# Patient Record
Sex: Male | Born: 1975
Health system: Southern US, Community
[De-identification: ages and names within clinical notes are randomized; demographics above are authoritative.]

## PROBLEM LIST (undated history)

## (undated) DIAGNOSIS — M545 Low back pain, unspecified: Secondary | ICD-10-CM

## (undated) DIAGNOSIS — I10 Essential (primary) hypertension: Secondary | ICD-10-CM

## (undated) DIAGNOSIS — K219 Gastro-esophageal reflux disease without esophagitis: Secondary | ICD-10-CM

## (undated) DIAGNOSIS — N200 Calculus of kidney: Secondary | ICD-10-CM

## (undated) DIAGNOSIS — G4733 Obstructive sleep apnea (adult) (pediatric): Secondary | ICD-10-CM

## (undated) HISTORY — DX: Gastro-esophageal reflux disease without esophagitis: K21.9

## (undated) HISTORY — PX: LITHOTRIPSY: SUR834

## (undated) HISTORY — DX: Essential (primary) hypertension: I10

## (undated) HISTORY — DX: Calculus of kidney: N20.0

## (undated) HISTORY — DX: Obstructive sleep apnea (adult) (pediatric): G47.33

## (undated) HISTORY — DX: Low back pain, unspecified: M54.50

## (undated) HISTORY — PX: WRIST SURGERY: SHX841

---
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
## 1998-06-05 ENCOUNTER — Encounter: Payer: Self-pay | Admitting: Emergency Medicine

## 1998-06-05 ENCOUNTER — Emergency Department (HOSPITAL_COMMUNITY): Admission: EM | Admit: 1998-06-05 | Discharge: 1998-06-05 | Payer: Self-pay | Admitting: Emergency Medicine

## 1999-07-19 HISTORY — PX: VASECTOMY: SHX75

## 2001-04-06 ENCOUNTER — Other Ambulatory Visit: Admission: RE | Admit: 2001-04-06 | Discharge: 2001-04-06 | Payer: Self-pay | Admitting: Urology

## 2002-02-11 ENCOUNTER — Emergency Department (HOSPITAL_COMMUNITY): Admission: EM | Admit: 2002-02-11 | Discharge: 2002-02-11 | Payer: Self-pay | Admitting: Emergency Medicine

## 2004-07-16 ENCOUNTER — Ambulatory Visit (HOSPITAL_COMMUNITY): Admission: RE | Admit: 2004-07-16 | Discharge: 2004-07-16 | Payer: Self-pay | Admitting: Pediatrics

## 2007-02-20 ENCOUNTER — Emergency Department (HOSPITAL_COMMUNITY): Admission: EM | Admit: 2007-02-20 | Discharge: 2007-02-20 | Payer: Self-pay | Admitting: *Deleted

## 2012-02-03 ENCOUNTER — Other Ambulatory Visit: Payer: Self-pay | Admitting: Urology

## 2012-04-17 DIAGNOSIS — K2100 Gastro-esophageal reflux disease with esophagitis, without bleeding: Secondary | ICD-10-CM | POA: Insufficient documentation

## 2012-11-01 DIAGNOSIS — M542 Cervicalgia: Secondary | ICD-10-CM | POA: Insufficient documentation

## 2014-09-12 DIAGNOSIS — J111 Influenza due to unidentified influenza virus with other respiratory manifestations: Secondary | ICD-10-CM | POA: Insufficient documentation

## 2014-12-25 DIAGNOSIS — J039 Acute tonsillitis, unspecified: Secondary | ICD-10-CM | POA: Insufficient documentation

## 2015-07-05 DIAGNOSIS — J0121 Acute recurrent ethmoidal sinusitis: Secondary | ICD-10-CM | POA: Insufficient documentation

## 2015-09-20 DIAGNOSIS — J069 Acute upper respiratory infection, unspecified: Secondary | ICD-10-CM | POA: Insufficient documentation

## 2015-09-20 DIAGNOSIS — J029 Acute pharyngitis, unspecified: Secondary | ICD-10-CM | POA: Insufficient documentation

## 2015-10-01 DIAGNOSIS — J040 Acute laryngitis: Secondary | ICD-10-CM | POA: Insufficient documentation

## 2015-10-01 DIAGNOSIS — H66002 Acute suppurative otitis media without spontaneous rupture of ear drum, left ear: Secondary | ICD-10-CM | POA: Insufficient documentation

## 2016-09-14 DIAGNOSIS — R030 Elevated blood-pressure reading, without diagnosis of hypertension: Secondary | ICD-10-CM | POA: Insufficient documentation

## 2016-10-19 DIAGNOSIS — R490 Dysphonia: Secondary | ICD-10-CM | POA: Insufficient documentation

## 2017-03-24 DIAGNOSIS — G4700 Insomnia, unspecified: Secondary | ICD-10-CM | POA: Insufficient documentation

## 2017-04-12 DIAGNOSIS — Z87442 Personal history of urinary calculi: Secondary | ICD-10-CM | POA: Insufficient documentation

## 2017-05-17 DIAGNOSIS — J0101 Acute recurrent maxillary sinusitis: Secondary | ICD-10-CM | POA: Insufficient documentation

## 2017-06-14 DIAGNOSIS — M5431 Sciatica, right side: Secondary | ICD-10-CM | POA: Insufficient documentation

## 2017-12-25 DIAGNOSIS — M25521 Pain in right elbow: Secondary | ICD-10-CM | POA: Diagnosis not present

## 2017-12-25 DIAGNOSIS — M542 Cervicalgia: Secondary | ICD-10-CM | POA: Diagnosis not present

## 2017-12-25 DIAGNOSIS — M5136 Other intervertebral disc degeneration, lumbar region: Secondary | ICD-10-CM | POA: Diagnosis not present

## 2017-12-29 DIAGNOSIS — N200 Calculus of kidney: Secondary | ICD-10-CM | POA: Diagnosis not present

## 2017-12-29 DIAGNOSIS — E291 Testicular hypofunction: Secondary | ICD-10-CM | POA: Diagnosis not present

## 2018-01-02 DIAGNOSIS — E291 Testicular hypofunction: Secondary | ICD-10-CM | POA: Diagnosis not present

## 2018-01-22 DIAGNOSIS — M5136 Other intervertebral disc degeneration, lumbar region: Secondary | ICD-10-CM | POA: Diagnosis not present

## 2018-01-22 DIAGNOSIS — M5416 Radiculopathy, lumbar region: Secondary | ICD-10-CM | POA: Diagnosis not present

## 2018-02-19 DIAGNOSIS — T63451A Toxic effect of venom of hornets, accidental (unintentional), initial encounter: Secondary | ICD-10-CM | POA: Diagnosis not present

## 2018-03-05 DIAGNOSIS — M5416 Radiculopathy, lumbar region: Secondary | ICD-10-CM | POA: Diagnosis not present

## 2018-03-05 DIAGNOSIS — M5136 Other intervertebral disc degeneration, lumbar region: Secondary | ICD-10-CM | POA: Diagnosis not present

## 2018-03-05 DIAGNOSIS — M542 Cervicalgia: Secondary | ICD-10-CM | POA: Diagnosis not present

## 2018-03-22 DIAGNOSIS — E291 Testicular hypofunction: Secondary | ICD-10-CM | POA: Diagnosis not present

## 2018-03-30 DIAGNOSIS — E291 Testicular hypofunction: Secondary | ICD-10-CM | POA: Diagnosis not present

## 2018-03-30 DIAGNOSIS — N529 Male erectile dysfunction, unspecified: Secondary | ICD-10-CM | POA: Diagnosis not present

## 2018-04-30 DIAGNOSIS — J029 Acute pharyngitis, unspecified: Secondary | ICD-10-CM | POA: Diagnosis not present

## 2018-04-30 DIAGNOSIS — Z6829 Body mass index (BMI) 29.0-29.9, adult: Secondary | ICD-10-CM | POA: Diagnosis not present

## 2018-06-20 DIAGNOSIS — E291 Testicular hypofunction: Secondary | ICD-10-CM | POA: Diagnosis not present

## 2018-06-26 DIAGNOSIS — N529 Male erectile dysfunction, unspecified: Secondary | ICD-10-CM | POA: Diagnosis not present

## 2018-06-26 DIAGNOSIS — E291 Testicular hypofunction: Secondary | ICD-10-CM | POA: Diagnosis not present

## 2019-07-25 ENCOUNTER — Other Ambulatory Visit: Payer: Self-pay

## 2019-07-25 ENCOUNTER — Ambulatory Visit: Payer: 59 | Attending: Internal Medicine

## 2019-07-25 DIAGNOSIS — Z20822 Contact with and (suspected) exposure to covid-19: Secondary | ICD-10-CM

## 2019-07-27 LAB — NOVEL CORONAVIRUS, NAA: SARS-CoV-2, NAA: NOT DETECTED

## 2019-07-29 ENCOUNTER — Ambulatory Visit: Payer: 59 | Attending: Internal Medicine

## 2019-07-29 ENCOUNTER — Other Ambulatory Visit: Payer: Self-pay

## 2019-07-29 DIAGNOSIS — Z20822 Contact with and (suspected) exposure to covid-19: Secondary | ICD-10-CM

## 2019-07-30 ENCOUNTER — Other Ambulatory Visit: Payer: Self-pay | Admitting: Urology

## 2019-07-30 ENCOUNTER — Other Ambulatory Visit: Payer: 59

## 2019-07-30 LAB — NOVEL CORONAVIRUS, NAA: SARS-CoV-2, NAA: DETECTED — AB

## 2019-08-14 ENCOUNTER — Other Ambulatory Visit: Payer: Self-pay

## 2019-08-14 DIAGNOSIS — E291 Testicular hypofunction: Secondary | ICD-10-CM

## 2019-08-15 ENCOUNTER — Other Ambulatory Visit: Payer: Self-pay | Admitting: Urology

## 2019-08-15 DIAGNOSIS — R079 Chest pain, unspecified: Secondary | ICD-10-CM | POA: Insufficient documentation

## 2019-08-15 LAB — CBC WITH DIFFERENTIAL/PLATELET
Absolute Monocytes: 536 cells/uL (ref 200–950)
Basophils Absolute: 57 cells/uL (ref 0–200)
Basophils Relative: 1.1 %
Eosinophils Absolute: 161 cells/uL (ref 15–500)
Eosinophils Relative: 3.1 %
HCT: 45.7 % (ref 38.5–50.0)
Hemoglobin: 15.5 g/dL (ref 13.2–17.1)
Lymphs Abs: 1872 cells/uL (ref 850–3900)
MCH: 30.3 pg (ref 27.0–33.0)
MCHC: 33.9 g/dL (ref 32.0–36.0)
MCV: 89.3 fL (ref 80.0–100.0)
MPV: 11.2 fL (ref 7.5–12.5)
Monocytes Relative: 10.3 %
Neutro Abs: 2574 cells/uL (ref 1500–7800)
Neutrophils Relative %: 49.5 %
Platelets: 67 10*3/uL — ABNORMAL LOW (ref 140–400)
RBC: 5.12 10*6/uL (ref 4.20–5.80)
RDW: 12.6 % (ref 11.0–15.0)
Total Lymphocyte: 36 %
WBC: 5.2 10*3/uL (ref 3.8–10.8)

## 2019-08-15 LAB — COMPREHENSIVE METABOLIC PANEL
AG Ratio: 2 (calc) (ref 1.0–2.5)
ALT: 16 U/L (ref 9–46)
AST: 15 U/L (ref 10–40)
Albumin: 4.3 g/dL (ref 3.6–5.1)
Alkaline phosphatase (APISO): 50 U/L (ref 36–130)
BUN: 13 mg/dL (ref 7–25)
CO2: 28 mmol/L (ref 20–32)
Calcium: 9.2 mg/dL (ref 8.6–10.3)
Chloride: 107 mmol/L (ref 98–110)
Creat: 1.35 mg/dL (ref 0.60–1.35)
Globulin: 2.1 g/dL (calc) (ref 1.9–3.7)
Glucose, Bld: 87 mg/dL (ref 65–99)
Potassium: 4 mmol/L (ref 3.5–5.3)
Sodium: 143 mmol/L (ref 135–146)
Total Bilirubin: 0.8 mg/dL (ref 0.2–1.2)
Total Protein: 6.4 g/dL (ref 6.1–8.1)

## 2019-08-15 LAB — PSA: PSA: 0.7 ng/mL (ref <=4.0)

## 2019-08-15 LAB — TESTOSTERONE TOTAL,FREE,BIO, MALES
Albumin: 4.3 g/dL (ref 3.6–5.1)
Sex Hormone Binding: 63 nmol/L — ABNORMAL HIGH (ref 10–50)
Testosterone, Bioavailable: 160.9 ng/dL (ref >=110.0)
Testosterone, Free: 81.7 pg/mL (ref 46.0–224.0)
Testosterone: 963 ng/dL — ABNORMAL HIGH (ref 250–827)

## 2019-08-15 LAB — ESTRADIOL: Estradiol: 31 pg/mL (ref <=39)

## 2019-08-27 ENCOUNTER — Encounter: Payer: Self-pay | Admitting: *Deleted

## 2019-08-28 ENCOUNTER — Encounter: Payer: Self-pay | Admitting: *Deleted

## 2019-08-28 ENCOUNTER — Encounter: Payer: Self-pay | Admitting: Cardiology

## 2019-08-28 ENCOUNTER — Other Ambulatory Visit: Payer: Self-pay

## 2019-08-28 ENCOUNTER — Telehealth: Payer: Self-pay | Admitting: Cardiology

## 2019-08-28 ENCOUNTER — Ambulatory Visit: Payer: 59 | Admitting: Cardiology

## 2019-08-28 VITALS — BP 140/100 | HR 73 | Ht 70.0 in | Wt 224.0 lb

## 2019-08-28 DIAGNOSIS — Z8249 Family history of ischemic heart disease and other diseases of the circulatory system: Secondary | ICD-10-CM | POA: Diagnosis not present

## 2019-08-28 DIAGNOSIS — I10 Essential (primary) hypertension: Secondary | ICD-10-CM | POA: Diagnosis not present

## 2019-08-28 DIAGNOSIS — K219 Gastro-esophageal reflux disease without esophagitis: Secondary | ICD-10-CM

## 2019-08-28 DIAGNOSIS — R0789 Other chest pain: Secondary | ICD-10-CM | POA: Diagnosis not present

## 2019-08-28 NOTE — Telephone Encounter (Signed)
Pre-cert Verification for the following procedure    Stress echo scheduled for 2021 at [facility].

## 2019-08-28 NOTE — Patient Instructions (Addendum)
Medication Instructions:   Your physician recommends that you continue on your current medications as directed. Please refer to the Current Medication list given to you today.  Labwork:  NONE  Testing/Procedures: Your physician has requested that you have a stress echocardiogram. For further information please visit [email or web address]. Please follow instruction sheet as given.  Follow-Up:  Your physician recommends that you schedule a follow-up appointment in: pending.  Any Other Special Instructions Will Be Listed Below (If Applicable).  If you need a refill on your cardiac medications before your next appointment, please call your pharmacy. 

## 2019-08-28 NOTE — Progress Notes (Signed)
Cardiology Office Note  Date: 08/28/2019   ID: Jamie Richmond, DOB [birth year], MRN [id number]  PCP:  Manon Hilding, MD  Consulting Cardiologist: Satira Sark, MD Electrophysiologist:  None   Chief Complaint  Patient presents with  . Chest Pain    History of Present Illness: Jamie Richmond is a 44 y.o. male referred for cardiology consultation by Dr. Quintin Alto for the evaluation of chest pain.  He states that over the last few months he has been experiencing episodes of a sharp discomfort in his chest followed by tightness, sometimes nausea and uneasiness when this happens.  He cannot identify any specific trigger however.  This is not related to foods.  He does have reflux and takes Protonix, denies any dysphagia however.  Symptoms are nonexertional.  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  He has not had any palpitations or syncope.  States that his blood pressure has been up over the last 6 months or so, now on Cozaar.  Lipid status is uncertain, but he is not aware of any history of hyperlipidemia.  I did review his recent lab work from Dr. Alyson Ingles as outlined below.  He is noted to be thrombocytopenic, new finding compared to previous lab work.  No spontaneous bleeding problems.  No clumping described on the sample.  He is following up with Dr. Noland Fordyce office regarding next step in work-up.   He does have a family history of CAD.  Also reports prior sleep testing and diagnosis of OSA, however not able to tolerate CPAP.  He quit smoking back in 2014.  Past Medical History:  Diagnosis Date  . Essential hypertension   . GERD (gastroesophageal reflux disease)   . Low back pain   . Nephrolithiasis   . OSA (obstructive sleep apnea)    Not on CPAP    Past Surgical History:  Procedure Laterality Date  . LITHOTRIPSY    . VASECTOMY  2001  . WRIST SURGERY Right    age 78    Current Outpatient Medications  Medication Sig Dispense  Refill  . anastrozole (ARIMIDEX) 1 MG tablet Take 0.5 mg by mouth daily.    . clomiPHENE (CLOMID) 50 MG tablet Take 1/2 (one-half) tablet by mouth once daily 15 tablet 0  . gabapentin (NEURONTIN) 100 MG capsule Take 100 mg by mouth at bedtime.    Marland Kitchen losartan (COZAAR) 25 MG tablet Take 25 mg by mouth daily.    . meloxicam (MOBIC) 15 MG tablet Take 15 mg by mouth daily.    . pantoprazole (PROTONIX) 40 MG tablet Take 40 mg by mouth 2 (two) times daily.    Marland Kitchen SILDENAFIL CITRATE PO Take 20 mg by mouth as needed.    Marland Kitchen tiZANidine (ZANAFLEX) 4 MG tablet Take 4 mg by mouth every 8 (eight) hours as needed for muscle spasms.    Marland Kitchen zolpidem (AMBIEN) 10 MG tablet Take 10 mg by mouth at bedtime as needed for sleep.     No current facility-administered medications for this visit.   Allergies:  Patient has no known allergies.   Social History: The patient  reports that he has quit smoking. His smoking use included cigarettes. He started smoking about 6 years ago. He smoked 0.50 packs per day. He has never used smokeless tobacco. He reports current alcohol use. He reports that he does not use drugs.   Family History: The patient's family history includes Bipolar disorder in his  sister and sister; Crohn's disease in his sister; Heart attack in his father and mother; Hypertension in his father, mother, sister, and sister.   ROS:  Please see the history of present illness. Otherwise, complete review of systems is positive for none.  All other systems are reviewed and negative.   Physical Exam: VS:  BP (!) 140/100   Pulse 73   Ht 5\' 10"  (1.778 m)   Wt 224 lb (101.6 kg)   SpO2 96%   BMI 32.14 kg/m , BMI Body mass index is 32.14 kg/m.  Wt Readings from Last 3 Encounters:  08/28/19 224 lb (101.6 kg)    General: Patient appears comfortable at rest. HEENT: Conjunctiva and lids normal, wearing a mask. Neck: Supple, no elevated JVP or carotid bruits, no thyromegaly. Lungs: Clear to auscultation, nonlabored  breathing at rest. Cardiac: Regular rate and rhythm, no S3 or significant systolic murmur, no pericardial rub. Abdomen: Soft, bowel sounds present. Extremities: No pitting edema, distal pulses 2+. Skin: Warm and dry. Musculoskeletal: No kyphosis. Neuropsychiatric: Alert and oriented x3, affect grossly appropriate.  ECG:  An ECG dated August 16, 2019 from [facility] was personally reviewed today and demonstrated:  Sinus bradycardia.  Recent Labwork: 08/14/2019: ALT 16; AST 15; BUN 13; Creat 1.35; Hemoglobin 15.5; Platelets 67; Potassium 4.0; Sodium 143  No lipid profile available for review.  Other Studies Reviewed Today:  No prior cardiac testing for review.  Assessment and Plan:  1.  Atypical chest pain as described above in a 44 year old male with hypertension, family history of CAD, previous tobacco use but quit in 2014.  Lipid status is uncertain.  I reviewed his recent ECG.  Plan is to proceed with an exercise echocardiogram for objective ischemic work-up.  2.  Essential hypertension, currently on Cozaar.  Blood pressure is elevated today.  Would continue to follow with Dr. 2015 in case additional adjustments are necessary.  Untreated OSA may also be contributing.  3.  Incidentally noted thrombocytopenia with platelet count 67 by recent lab work by Dr. Neita Carp.  No clumping reported on sample and differential otherwise looked okay.  He plans to follow-up with Dr. Ronne Binning office.  Would anticipate a repeat blood draw and if platelets remain low consider hematology consultation.  4.  Uncertain lipid status.  Would follow with Dr. Dimas Millin for surveillance.  5.  GERD, on Protonix.  Medication Adjustments/Labs and Tests Ordered: Current medicines are reviewed at length with the patient today.  Concerns regarding medicines are outlined above.   Tests Ordered: Orders Placed This Encounter  Procedures  . ECHOCARDIOGRAM STRESS TEST    Medication Changes: No orders of the  defined types were placed in this encounter.   Disposition:  Follow up test results.  Signed, Neita Carp, MD, [facility] 08/28/2019 9:37 AM    [facility] at [facility] [address] [place], [place], [place] Phone: [phone]; Fax: [phone]

## 2019-09-02 ENCOUNTER — Ambulatory Visit (HOSPITAL_COMMUNITY)
Admission: RE | Admit: 2019-09-02 | Discharge: 2019-09-02 | Disposition: A | Discharge status: Home / Self Care | Payer: 59 | Source: Ambulatory Visit | Attending: Cardiology | Admitting: Cardiology

## 2019-09-02 ENCOUNTER — Other Ambulatory Visit: Payer: Self-pay

## 2019-09-02 DIAGNOSIS — Z8249 Family history of ischemic heart disease and other diseases of the circulatory system: Secondary | ICD-10-CM | POA: Insufficient documentation

## 2019-09-02 DIAGNOSIS — R0789 Other chest pain: Secondary | ICD-10-CM

## 2019-09-02 NOTE — Progress Notes (Signed)
*  PRELIMINARY RESULTS* Echocardiogram Echocardiogram Stress Test has been performed.  Jamie Richmond 09/02/2019, 10:27 AM

## 2019-09-04 ENCOUNTER — Telehealth: Payer: Self-pay | Admitting: *Deleted

## 2019-09-04 ENCOUNTER — Other Ambulatory Visit: Payer: Self-pay | Admitting: Urology

## 2019-09-04 NOTE — Telephone Encounter (Signed)
Patient informed. Copy sent to PCP °

## 2019-09-04 NOTE — Telephone Encounter (Signed)
-----   Message from Jonelle Sidle, MD sent at 09/02/2019  6:03 PM EST ----- Results reviewed.  Please let him know that the stress test looked good, no evidence of ischemia to suggest presence of obstructive CAD.  Would encourage him to keep follow-up with PCP at this point.

## 2019-09-29 ENCOUNTER — Other Ambulatory Visit: Payer: Self-pay | Admitting: Urology

## 2020-01-14 ENCOUNTER — Other Ambulatory Visit: Payer: Self-pay

## 2020-01-14 DIAGNOSIS — E291 Testicular hypofunction: Secondary | ICD-10-CM

## 2020-03-05 NOTE — Addendum Note (Signed)
Addended by: Ferdinand Lango on: 03/05/2020 01:19 PM   Modules accepted: Orders

## 2020-04-06 ENCOUNTER — Other Ambulatory Visit: Payer: 59

## 2020-04-09 ENCOUNTER — Ambulatory Visit: Payer: 59 | Admitting: Urology

## 2020-09-01 ENCOUNTER — Other Ambulatory Visit: Payer: Self-pay | Admitting: Urology

## 2020-12-17 ENCOUNTER — Other Ambulatory Visit: Payer: Self-pay

## 2020-12-17 DIAGNOSIS — E291 Testicular hypofunction: Secondary | ICD-10-CM

## 2020-12-17 NOTE — Progress Notes (Signed)
Patient called for lab orders prior to his July ov. Perfers [facility]. Labs ordered printed and left at desk for pt to pick up.

## 2021-01-15 ENCOUNTER — Other Ambulatory Visit: Payer: Self-pay

## 2021-01-15 DIAGNOSIS — E291 Testicular hypofunction: Secondary | ICD-10-CM

## 2021-01-16 LAB — COMPREHENSIVE METABOLIC PANEL
ALT: 31 IU/L (ref 0–44)
AST: 25 IU/L (ref 0–40)
Albumin/Globulin Ratio: 2 (ref 1.2–2.2)
Albumin: 4.5 g/dL (ref 4.0–5.0)
Alkaline Phosphatase: 56 IU/L (ref 44–121)
BUN/Creatinine Ratio: 12 (ref 9–20)
BUN: 15 mg/dL (ref 6–24)
Bilirubin Total: 0.6 mg/dL (ref 0.0–1.2)
CO2: 23 mmol/L (ref 20–29)
Calcium: 9.4 mg/dL (ref 8.7–10.2)
Chloride: 105 mmol/L (ref 96–106)
Creatinine, Ser: 1.23 mg/dL (ref 0.76–1.27)
Globulin, Total: 2.2 g/dL (ref 1.5–4.5)
Glucose: 78 mg/dL (ref 65–99)
Potassium: 4.6 mmol/L (ref 3.5–5.2)
Sodium: 141 mmol/L (ref 134–144)
Total Protein: 6.7 g/dL (ref 6.0–8.5)
eGFR: 74 mL/min/{1.73_m2} (ref >59)

## 2021-01-16 LAB — CBC
Hematocrit: 50 % (ref 37.5–51.0)
Hemoglobin: 16.5 g/dL (ref 13.0–17.7)
MCH: 31.2 pg (ref 26.6–33.0)
MCHC: 33 g/dL (ref 31.5–35.7)
MCV: 95 fL (ref 79–97)
RBC: 5.29 x10E6/uL (ref 4.14–5.80)
RDW: 12.6 % (ref 11.6–15.4)
WBC: 6.2 10*3/uL (ref 3.4–10.8)

## 2021-01-16 LAB — TESTOSTERONE: Testosterone: 740 ng/dL (ref 264–916)

## 2021-01-16 LAB — ESTRADIOL: Estradiol: 28.2 pg/mL (ref 7.6–42.6)

## 2021-01-26 NOTE — Progress Notes (Signed)
Sent via mychart

## 2021-01-29 ENCOUNTER — Encounter: Payer: Self-pay | Admitting: Urology

## 2021-01-29 ENCOUNTER — Other Ambulatory Visit: Payer: Self-pay

## 2021-01-29 ENCOUNTER — Ambulatory Visit (INDEPENDENT_AMBULATORY_CARE_PROVIDER_SITE_OTHER): Payer: 59 | Admitting: Urology

## 2021-01-29 VITALS — BP 137/85 | HR 69

## 2021-01-29 DIAGNOSIS — N5201 Erectile dysfunction due to arterial insufficiency: Secondary | ICD-10-CM

## 2021-01-29 DIAGNOSIS — E291 Testicular hypofunction: Secondary | ICD-10-CM | POA: Insufficient documentation

## 2021-01-29 HISTORY — DX: Erectile dysfunction due to arterial insufficiency: N52.01

## 2021-01-29 HISTORY — DX: Testicular hypofunction: E29.1

## 2021-01-29 LAB — URINALYSIS, ROUTINE W REFLEX MICROSCOPIC
Bilirubin, UA: NEGATIVE
Glucose, UA: NEGATIVE
Ketones, UA: NEGATIVE
Leukocytes,UA: NEGATIVE
Nitrite, UA: NEGATIVE
Protein,UA: NEGATIVE
RBC, UA: NEGATIVE
Specific Gravity, UA: 1.02 (ref 1.005–1.030)
Urobilinogen, Ur: 0.2 mg/dL (ref 0.2–1.0)
pH, UA: 7 (ref 5.0–7.5)

## 2021-01-29 MED ORDER — TESTOSTERONE CYPIONATE 200 MG/ML IM SOLN: 200.0000 mg | INTRAMUSCULAR | 3 refills | Status: DC

## 2021-01-29 MED ORDER — SILDENAFIL CITRATE 20 MG PO TABS: 20.0000 mg | ORAL_TABLET | ORAL | 3 refills | Status: AC | PRN

## 2021-01-29 MED ORDER — TADALAFIL 20 MG PO TABS: 20.0000 mg | ORAL_TABLET | Freq: Every day | ORAL | 5 refills | Status: DC | PRN

## 2021-01-29 NOTE — Progress Notes (Signed)
Urological Symptom Review  Patient is experiencing the following symptoms: Kidney stones   Review of Systems  Gastrointestinal (upper)  : Negative for upper GI symptoms  Gastrointestinal (lower) : Negative for lower GI symptoms  Constitutional : Negative for symptoms  Skin: Negative for skin symptoms  Eyes: Negative for eye symptoms  Ear/Nose/Throat : Negative for Ear/Nose/Throat symptoms  Hematologic/Lymphatic: Negative for Hematologic/Lymphatic symptoms  Cardiovascular : Negative for cardiovascular symptoms  Respiratory : Negative for respiratory symptoms  Endocrine: Negative for endocrine symptoms  Musculoskeletal: Back pain  Neurological: Negative for neurological symptoms  Psychologic: Negative for psychiatric symptoms  

## 2021-01-29 NOTE — Progress Notes (Signed)
01/29/2021 2:06 PM   Jamie Richmond 05-03-1976 [id number]  Referring provider: Estanislado Pandy, MD [address] [place],  [place] [address]  Hypogonadism and ED   HPI: Jamie Richmond is a 45yo here for followup for ED and hypogonadism. He has tapered off anastrazole and clomid. Testosteron 740. CMP normal, Hgb 16.5 He has fair erections with tadalafil and sildenafil.   PMH: Past Medical History:  Diagnosis Date   Essential hypertension    GERD (gastroesophageal reflux disease)    Low back pain    Nephrolithiasis    OSA (obstructive sleep apnea)    Not on CPAP    Surgical History: Past Surgical History:  Procedure Laterality Date   LITHOTRIPSY     VASECTOMY  2001   WRIST SURGERY Right    age 17    Home Medications:  Allergies as of 01/29/2021   No Known Allergies      Medication List        Accurate as of January 29, 2021  2:06 PM. If you have any questions, ask your nurse or doctor.          anastrozole 1 MG tablet Commonly known as: ARIMIDEX TAKE 1/2 (ONE-HALF) TABLET BY MOUTH EVERY OTHER DAY   clomiPHENE 50 MG tablet Commonly known as: CLOMID Take 1/2 (one-half) tablet by mouth once daily   gabapentin 100 MG capsule Commonly known as: NEURONTIN Take 100 mg by mouth at bedtime.   losartan 25 MG tablet Commonly known as: COZAAR Take 25 mg by mouth daily.   losartan 50 MG tablet Commonly known as: COZAAR Take 50 mg by mouth daily.   meloxicam 15 MG tablet Commonly known as: MOBIC Take 15 mg by mouth daily.   pantoprazole 40 MG tablet Commonly known as: PROTONIX Take 40 mg by mouth 2 (two) times daily.   rosuvastatin 10 MG tablet Commonly known as: CRESTOR Take 10 mg by mouth at bedtime.   SILDENAFIL CITRATE PO Take 20 mg by mouth as needed.   tiZANidine 4 MG tablet Commonly known as: ZANAFLEX Take 4 mg by mouth every 8 (eight) hours as needed for muscle spasms.   zolpidem 10 MG tablet Commonly known as: AMBIEN Take 10 mg by mouth at bedtime  as needed for sleep.        Allergies: No Known Allergies  Family History: Family History  Problem Relation Age of Onset   Heart attack Mother    Hypertension Mother    Hypertension Father    Heart attack Father    Bipolar disorder Sister    Hypertension Sister    Crohn's disease Sister    Bipolar disorder Sister    Hypertension Sister     Social History:  reports that he has quit smoking. His smoking use included cigarettes. He started smoking about 8 years ago. He smoked an average of .5 packs per day. He has never used smokeless tobacco. He reports current alcohol use. He reports that he does not use drugs.  ROS: All other review of systems were reviewed and are negative except what is noted above in HPI  Physical Exam: BP 137/85   Pulse 69   Constitutional:  Alert and oriented, No acute distress. HEENT: [place] AT, moist mucus membranes.  Trachea midline, no masses. Cardiovascular: No clubbing, cyanosis, or edema. Respiratory: Normal respiratory effort, no increased work of breathing. GI: Abdomen is soft, nontender, nondistended, no abdominal masses GU: No CVA tenderness.  Lymph: No cervical or inguinal  lymphadenopathy. Skin: No rashes, bruises or suspicious lesions. Neurologic: Grossly intact, no focal deficits, moving all 4 extremities. Psychiatric: Normal mood and affect.  Laboratory Data: Lab Results  Component Value Date   WBC 6.2 01/15/2021   HGB 16.5 01/15/2021   HCT 50.0 01/15/2021   MCV 95 01/15/2021   PLT Comment 01/15/2021    Lab Results  Component Value Date   CREATININE 1.23 01/15/2021    Lab Results  Component Value Date   PSA 0.7 08/14/2019    Lab Results  Component Value Date   TESTOSTERONE 740 01/15/2021    No results found for: HGBA1C  Urinalysis No results found for: COLORURINE, APPEARANCEUR, LABSPEC, PHURINE, GLUCOSEU, HGBUR, BILIRUBINUR, KETONESUR, PROTEINUR, UROBILINOGEN, NITRITE, LEUKOCYTESUR  No results found for: LABMICR,  WBCUA, RBCUA, LABEPIT, MUCUS, BACTERIA  Pertinent Imaging:  No results found for this or any previous visit.  No results found for this or any previous visit.  No results found for this or any previous visit.  No results found for this or any previous visit.  No results found for this or any previous visit.  No results found for this or any previous visit.  No results found for this or any previous visit.  No results found for this or any previous visit.   Assessment & Plan:    1. Male hypogonadism -testosteron 200mg  q 2 weeks -RTC 3 months with labs - Urinalysis, Routine w reflex microscopic  2. Erectile dysfunction due to arterial insufficiency -continue sildenafil and tadalafil   No follow-ups on file.  , MD  [facility]

## 2021-01-29 NOTE — Patient Instructions (Signed)
Erectile Dysfunction Erectile dysfunction (ED) is the inability to get or keep an erection in order to have sexual intercourse. ED is considered a symptom of an underlying disorder and not considered a disease. Erectile dysfunction may include: Inability to get an erection. Lack of enough hardness of the erection to allow penetration. Loss of the erection before sex is finished. What are the causes? This condition may be caused by: Certain medicines, such as: Pain relievers. Antihistamines. Antidepressants. Blood pressure medicines. Water pills (diuretics). Ulcer medicines. Muscle relaxants. Drugs. Excessive drinking. Psychological causes, such as: Anxiety. Depression. Sadness. Exhaustion. Performance fear. Stress. Physical causes, such as: Artery problems. This may include diabetes, smoking, liver disease, or atherosclerosis. High blood pressure. Hormonal problems, such as low testosterone. Obesity. Nerve problems. This may include back or pelvic injuries, diabetes mellitus, multiple sclerosis, or Parkinson's disease. What are the signs or symptoms? Symptoms of this condition include: Inability to get an erection. Lack of enough hardness of the erection to allow penetration. Loss of the erection before sex is finished. Normal erections at some times, but with frequent unsatisfactory episodes. Low sexual satisfaction in either partner due to erection problems. A curved penis occurring with erection. The curve may cause pain or the penis may be too curved to allow for intercourse. Never having nighttime erections. How is this diagnosed? This condition is often diagnosed by: Performing a physical exam to find other diseases or specific problems with the penis. Asking you detailed questions about the problem. Performing blood tests to check for diabetes mellitus or to measure hormone levels. Performing other tests to check for underlying health conditions. Performing an  ultrasound exam to check for scarring. Performing a test to check blood flow to the penis. Doing a sleep study at home to measure nighttime erections. How is this treated? This condition may be treated by: Medicine taken by mouth to help you achieve an erection (oral medicine). Hormone replacement therapy to replace low testosterone levels. Medicine that is injected into the penis. Your health care provider may instruct you how to give yourself these injections at home. Vacuum pump. This is a pump with a ring on it. The pump and ring are placed on the penis and used to create pressure that helps the penis become erect. Penile implant surgery. In this procedure, you may receive: An inflatable implant. This consists of cylinders, a pump, and a reservoir. The cylinders can be inflated with a fluid that helps to create an erection, and they can be deflated after intercourse. A semi-rigid implant. This consists of two silicone rubber rods. The rods provide some rigidity. They are also flexible, so the penis can both curve downward in its normal position and become straight for sexual intercourse. Blood vessel surgery, to improve blood flow to the penis. During this procedure, a blood vessel from a different part of the body is placed into the penis to allow blood to flow around (bypass) damaged or blocked blood vessels. Lifestyle changes, such as exercising more, losing weight, and quitting smoking. Follow these instructions at home: Medicines  Take over-the-counter and prescription medicines only as told by your health care provider. Do not increase the dosage without first discussing it with your health care provider. If you are using self-injections, perform injections as directed by your health care provider. Make sure to avoid any veins that are on the surface of the penis. After giving an injection, apply pressure to the injection site for 5 minutes.  General instructions Exercise regularly, as    directed by your health care provider. Work with your health care provider to lose weight, if needed. Do not use any products that contain nicotine or tobacco, such as cigarettes and e-cigarettes. If you need help quitting, ask your health care provider. Before using a vacuum pump, read the instructions that come with the pump and discuss any questions with your health care provider. Keep all follow-up visits as told by your health care provider. This is important. Contact a health care provider if: You feel nauseous. You vomit. Get help right away if: You are taking oral or injectable medicines and you have an erection that lasts longer than 4 hours. If your health care provider is unavailable, go to the nearest emergency room for evaluation. An erection that lasts much longer than 4 hours can result in permanent damage to your penis. You have severe pain in your groin or abdomen. You develop redness or severe swelling of your penis. You have redness spreading up into your groin or lower abdomen. You are unable to urinate. You experience chest pain or a rapid heart beat (palpitations) after taking oral medicines. Summary Erectile dysfunction (ED) is the inability to get or keep an erection during sexual intercourse. This problem can usually be treated successfully. This condition is diagnosed based on a physical exam, your symptoms, and tests to determine the cause. Treatment varies depending on the cause and may include medicines, hormone therapy, surgery, or a vacuum pump. You may need follow-up visits to make sure that you are using your medicines or devices correctly. Get help right away if you are taking or injecting medicines and you have an erection that lasts longer than 4 hours. This information is not intended to replace advice given to you by your health care provider. Make sure you discuss any questions you have with your healthcare provider. Document Revised: 06/23/2020 Document  Reviewed: 03/20/2020 Elsevier Patient Education  2022 [employer] Inc.  

## 2021-02-22 ENCOUNTER — Telehealth: Payer: Self-pay | Admitting: Urology

## 2021-02-22 NOTE — Telephone Encounter (Signed)
Patient is requesting to go back to doing testosterone injections weekly instead of biweekly. He states at the end of his 2 weeks he is feeling drained. When he was taking them weekly he was having no down time.

## 2021-02-22 NOTE — Telephone Encounter (Signed)
Pt called wanting to change the dosage on his injections. Please call him when you can.

## 2021-02-25 NOTE — Telephone Encounter (Signed)
Can you update the rx please.

## 2021-03-23 ENCOUNTER — Other Ambulatory Visit: Payer: Self-pay | Admitting: Urology

## 2021-03-23 MED ORDER — TESTOSTERONE CYPIONATE 200 MG/ML IM SOLN: 100.0000 mg | INTRAMUSCULAR | 3 refills | Status: DC

## 2021-03-30 ENCOUNTER — Telehealth: Payer: Self-pay

## 2021-03-30 NOTE — Telephone Encounter (Signed)
PA submitted for testosterone

## 2021-04-19 ENCOUNTER — Telehealth: Payer: Self-pay

## 2021-04-19 NOTE — Telephone Encounter (Signed)
Pt called said that he didn't get his  rx for testosterone, pt rx was [facility] to [facility] not [facility].  Pt want this sent to [facility]. I explain to the pat that  Dr Arlyss Repress wasn't in office today . I could call to see  [facility] has the medication in stock to fill it this one time. Then he can continue to get his medication from [facility]  Spoke with the pharmacist at [facility] okay 1 fill for 14 days , and removed [facility] from his list. Called advised pt  this.

## 2021-04-30 ENCOUNTER — Other Ambulatory Visit: Payer: Self-pay

## 2021-04-30 DIAGNOSIS — E291 Testicular hypofunction: Secondary | ICD-10-CM

## 2021-04-30 NOTE — Telephone Encounter (Signed)
Patient came into the office today for labs.  He is asking if we can send in a prescription for 27ml testosterone medication to the [facility] in [place].

## 2021-04-30 NOTE — Telephone Encounter (Signed)
See below

## 2021-05-01 LAB — COMPREHENSIVE METABOLIC PANEL
ALT: 31 IU/L (ref 0–44)
AST: 27 IU/L (ref 0–40)
Albumin/Globulin Ratio: 2.2 (ref 1.2–2.2)
Albumin: 4.6 g/dL (ref 4.0–5.0)
Alkaline Phosphatase: 63 IU/L (ref 44–121)
BUN/Creatinine Ratio: 10 (ref 9–20)
BUN: 12 mg/dL (ref 6–24)
Bilirubin Total: 0.9 mg/dL (ref 0.0–1.2)
CO2: 25 mmol/L (ref 20–29)
Calcium: 9 mg/dL (ref 8.7–10.2)
Chloride: 104 mmol/L (ref 96–106)
Creatinine, Ser: 1.21 mg/dL (ref 0.76–1.27)
Globulin, Total: 2.1 g/dL (ref 1.5–4.5)
Glucose: 88 mg/dL (ref 70–99)
Potassium: 4.5 mmol/L (ref 3.5–5.2)
Sodium: 141 mmol/L (ref 134–144)
Total Protein: 6.7 g/dL (ref 6.0–8.5)
eGFR: 76 mL/min/{1.73_m2} (ref >59)

## 2021-05-01 LAB — CBC WITH DIFFERENTIAL/PLATELET
Basophils Absolute: 0.1 10*3/uL (ref 0.0–0.2)
Basos: 1 %
EOS (ABSOLUTE): 0.1 10*3/uL (ref 0.0–0.4)
Eos: 2 %
Hematocrit: 48.1 % (ref 37.5–51.0)
Hemoglobin: 15.9 g/dL (ref 13.0–17.7)
Immature Grans (Abs): 0 10*3/uL (ref 0.0–0.1)
Immature Granulocytes: 0 %
Lymphocytes Absolute: 2.1 10*3/uL (ref 0.7–3.1)
Lymphs: 37 %
MCH: 28.6 pg (ref 26.6–33.0)
MCHC: 33.1 g/dL (ref 31.5–35.7)
MCV: 87 fL (ref 79–97)
Monocytes Absolute: 0.7 10*3/uL (ref 0.1–0.9)
Monocytes: 12 %
Neutrophils Absolute: 2.7 10*3/uL (ref 1.4–7.0)
Neutrophils: 48 %
Platelets: 91 10*3/uL — CL (ref 150–450)
RBC: 5.56 x10E6/uL (ref 4.14–5.80)
RDW: 12.9 % (ref 11.6–15.4)
WBC: 5.7 10*3/uL (ref 3.4–10.8)

## 2021-05-03 ENCOUNTER — Other Ambulatory Visit: Payer: Self-pay | Admitting: Urology

## 2021-05-03 MED ORDER — TESTOSTERONE CYPIONATE 200 MG/ML IM SOLN: 100.0000 mg | INTRAMUSCULAR | 3 refills | Status: DC

## 2021-05-05 ENCOUNTER — Telehealth: Payer: Self-pay

## 2021-05-05 LAB — TESTOSTERONE,FREE AND TOTAL
Testosterone, Free: 11.9 pg/mL (ref 6.8–21.5)
Testosterone: 941 ng/dL — ABNORMAL HIGH (ref 264–916)

## 2021-05-05 LAB — SPECIMEN STATUS REPORT

## 2021-05-05 NOTE — Telephone Encounter (Signed)
PA started for Tadalafil on covermymed

## 2021-05-07 ENCOUNTER — Encounter: Payer: Self-pay | Admitting: Urology

## 2021-05-07 ENCOUNTER — Other Ambulatory Visit: Payer: Self-pay

## 2021-05-07 ENCOUNTER — Ambulatory Visit (INDEPENDENT_AMBULATORY_CARE_PROVIDER_SITE_OTHER): Payer: Self-pay | Admitting: Urology

## 2021-05-07 VITALS — BP 160/70 | HR 72

## 2021-05-07 DIAGNOSIS — N5201 Erectile dysfunction due to arterial insufficiency: Secondary | ICD-10-CM

## 2021-05-07 DIAGNOSIS — E291 Testicular hypofunction: Secondary | ICD-10-CM

## 2021-05-07 MED ORDER — TADALAFIL 20 MG PO TABS: 20.0000 mg | ORAL_TABLET | Freq: Every day | ORAL | 5 refills | Status: DC | PRN

## 2021-05-07 MED ORDER — TESTOSTERONE CYPIONATE 200 MG/ML IM SOLN: 100.0000 mg | INTRAMUSCULAR | 3 refills | Status: AC

## 2021-05-07 NOTE — Progress Notes (Signed)
Urological Symptom Review  Patient is experiencing the following symptoms: Erection problems (male only)   Review of Systems  Gastrointestinal (upper)  : Negative for upper GI symptoms  Gastrointestinal (lower) : Negative for lower GI symptoms  Constitutional : Negative for symptoms  Skin: Negative for skin symptoms  Eyes: Negative for eye symptoms  Ear/Nose/Throat : negative  Hematologic/Lymphatic: Negative for Hematologic/Lymphatic symptoms  Cardiovascular : Negative for cardiovascular symptoms  Respiratory : Negative for respiratory symptoms  Endocrine: Negative for endocrine symptoms  Musculoskeletal: Back pain  Neurological: Negative for neurological symptoms  Psychologic: Negative for psychiatric symptoms

## 2021-05-07 NOTE — Progress Notes (Signed)
05/07/2021 12:31 PM   Jamie Richmond 07/13/1976 [id number]  Referring provider: Estanislado Pandy, MD [address] [place],  [place] [address]  Hypogonadism and erectile dysfunction   HPI: Mr Jamie Richmond is a 45yo here for followup for hypogonadism and erectile dysfunction. Testosterone 9421, hgb 15.9. CMP normal. He injects 100mg  every 7 days. Energy is improving. Libido is good. Erections are good with both sildenafil and tadalfil   PMH: Past Medical History:  Diagnosis Date   Essential hypertension    GERD (gastroesophageal reflux disease)    Low back pain    Nephrolithiasis    OSA (obstructive sleep apnea)    Not on CPAP    Surgical History: Past Surgical History:  Procedure Laterality Date   LITHOTRIPSY     VASECTOMY  2001   WRIST SURGERY Right    age 35    Home Medications:  Allergies as of 05/07/2021   No Known Allergies      Medication List        Accurate as of May 07, 2021 12:31 PM. If you have any questions, ask your nurse or doctor.          gabapentin 100 MG capsule Commonly known as: NEURONTIN Take 100 mg by mouth at bedtime.   gabapentin 300 MG capsule Commonly known as: NEURONTIN Take 300 mg by mouth 3 (three) times daily.   losartan 25 MG tablet Commonly known as: COZAAR Take 25 mg by mouth daily.   losartan 50 MG tablet Commonly known as: COZAAR Take 50 mg by mouth daily.   meloxicam 15 MG tablet Commonly known as: MOBIC Take 15 mg by mouth daily.   pantoprazole 40 MG tablet Commonly known as: PROTONIX Take 40 mg by mouth 2 (two) times daily.   rosuvastatin 10 MG tablet Commonly known as: CRESTOR Take 10 mg by mouth at bedtime.   sildenafil 20 MG tablet Commonly known as: REVATIO Take 1 tablet (20 mg total) by mouth as needed.   tadalafil 20 MG tablet Commonly known as: CIALIS Take 1 tablet (20 mg total) by mouth daily as needed.   testosterone cypionate 200 MG/ML injection Commonly known as: DEPOTESTOSTERONE  CYPIONATE Inject 0.5 mLs (100 mg total) into the muscle every 14 (fourteen) days.   tiZANidine 4 MG tablet Commonly known as: ZANAFLEX Take 4 mg by mouth every 8 (eight) hours as needed for muscle spasms.   zolpidem 10 MG tablet Commonly known as: AMBIEN Take 10 mg by mouth at bedtime as needed for sleep.        Allergies: No Known Allergies  Family History: Family History  Problem Relation Age of Onset   Heart attack Mother    Hypertension Mother    Hypertension Father    Heart attack Father    Bipolar disorder Sister    Hypertension Sister    Crohn's disease Sister    Bipolar disorder Sister    Hypertension Sister     Social History:  reports that he has quit smoking. His smoking use included cigarettes. He started smoking about 8 years ago. He smoked an average of .5 packs per day. He has never used smokeless tobacco. He reports current alcohol use. He reports that he does not use drugs.  ROS: All other review of systems were reviewed and are negative except what is noted above in HPI  Physical Exam: BP (!) 160/70   Pulse 72   Constitutional:  Alert and oriented, No acute distress. HEENT: [place]  AT, moist mucus membranes.  Trachea midline, no masses. Cardiovascular: No clubbing, cyanosis, or edema. Respiratory: Normal respiratory effort, no increased work of breathing. GI: Abdomen is soft, nontender, nondistended, no abdominal masses GU: No CVA tenderness.  Lymph: No cervical or inguinal lymphadenopathy. Skin: No rashes, bruises or suspicious lesions. Neurologic: Grossly intact, no focal deficits, moving all 4 extremities. Psychiatric: Normal mood and affect.  Laboratory Data: Lab Results  Component Value Date   WBC 5.7 04/30/2021   HGB 15.9 04/30/2021   HCT 48.1 04/30/2021   MCV 87 04/30/2021   PLT 91 (LL) 04/30/2021    Lab Results  Component Value Date   CREATININE 1.21 04/30/2021    Lab Results  Component Value Date   PSA 0.7 08/14/2019    Lab  Results  Component Value Date   TESTOSTERONE 941 (H) 04/30/2021    No results found for: HGBA1C  Urinalysis    Component Value Date/Time   APPEARANCEUR Clear 01/29/2021 1404   GLUCOSEU Negative 01/29/2021 1404   BILIRUBINUR Negative 01/29/2021 1404   PROTEINUR Negative 01/29/2021 1404   NITRITE Negative 01/29/2021 1404   LEUKOCYTESUR Negative 01/29/2021 1404    Lab Results  Component Value Date   LABMICR Comment 01/29/2021    Pertinent Imaging:  No results found for this or any previous visit.  No results found for this or any previous visit.  No results found for this or any previous visit.  No results found for this or any previous visit.  No results found for this or any previous visit.  No results found for this or any previous visit.  No results found for this or any previous visit.  No results found for this or any previous visit.   Assessment & Plan:    1. Erectile dysfunction due to arterial insufficiency -continue tadalafil 20mg  prn  2. Male hypogonadism -COntinue testosterone 100mg  every 7 days -RCT 6 months with testosterone labs   No follow-ups on file.  , MD  [facility] [place]

## 2021-05-07 NOTE — Patient Instructions (Signed)
Testosterone Injection What is this medication? TESTOSTERONE (tes TOS ter one) is used to increase testosterone levels in your body. It belongs to a group of medications called androgen hormones. This medicine may be used for other purposes; ask your health care provider or pharmacist if you have questions. COMMON BRAND NAME(S): Andro-L.A., Aveed, Delatestryl, Depo-Testosterone, Virilon What should I tell my care team before I take this medication? They need to know if you have any of these conditions: Cancer Diabetes Heart disease Kidney disease Liver disease Lung disease Prostate disease An unusual or allergic reaction to testosterone, other medications, foods, dyes, or preservatives If a male partner is pregnant or trying to get pregnant Breast-feeding How should I use this medication? This medication is for injection into a muscle. It is usually given in a hospital or clinic. A special MedGuide will be given to you by the pharmacist with each prescription and refill. Be sure to read this information carefully each time. Contact your care team regarding the use of this medication in children. While this medication may be prescribed for children as young as 12 years of age for selected conditions, precautions do apply. Overdosage: If you think you have taken too much of this medicine contact a poison control center or emergency room at once. NOTE: This medicine is only for you. Do not share this medicine with others. What if I miss a dose? Try not to miss a dose. Your care team will tell you when your next injection is due. Notify the office if you are unable to keep an appointment. What may interact with this medication? Medications for diabetes Medications that treat or prevent blood clots like warfarin Oxyphenbutazone Propranolol Steroid medications like prednisone or cortisone This list may not describe all possible interactions. Give your health care provider a list of all the  medicines, herbs, non-prescription drugs, or dietary supplements you use. Also tell them if you smoke, drink alcohol, or use illegal drugs. Some items may interact with your medicine. What should I watch for while using this medication? Visit your care team for regular checks on your progress. They will need to check the level of testosterone in your blood. This medication is only approved for use in men who have low levels of testosterone related to certain medical conditions. Heart attacks and strokes have been reported with the use of this medication. Notify your care team and seek emergency treatment if you develop breathing problems; changes in vision; confusion; chest pain or chest tightness; sudden arm pain; severe, sudden headache; trouble speaking or understanding; sudden numbness or weakness of the face, arm or leg; loss of balance or coordination. Talk to your care team about the risks and benefits of this medication. This medication may affect blood sugar levels. If you have diabetes, check with your care team before you change your diet or the dose of your diabetic medication. Testosterone injections are not commonly used in women. Women should inform their care team if they wish to become pregnant or think they might be pregnant. There is a potential for serious side effects to an unborn child. Talk to your care team or pharmacist for more information. Talk with your care team about your birth control options while taking this medication. This medication is banned from use in athletes by most athletic organizations. What side effects may I notice from receiving this medication? Side effects that you should report to your care team as soon as possible: Allergic reactions-skin rash, itching, hives, swelling of the   face, lips, tongue, or throat Blood clot-pain, swelling, or warmth in the leg, shortness of breath, chest pain Heart attack-pain or tightness in the chest, shoulders, arms or jaw,  nausea, shortness of breath, cold or clammy skin, feeling faint or lightheaded Increase in blood pressure Liver injury-right upper belly pain, loss of appetite, nausea, light-colored stool, dark yellow or brown urine, yellowing of the skin or eyes, unusual weakness or fatigue Mood swings, irritability, or hostility Prolonged or painful erection Sleep apnea-loud snoring, gasping during sleep, daytime sleepiness Stroke-sudden numbness or weakness of the face, arm or leg, trouble speaking, confusion, trouble walking, loss of balance or coordination, dizziness, severe headache, change in vision Swelling of the ankles, hands, or feet Thoughts of suicide or self-harm, worsening mood, feelings of depression Side effects that usually do not require medical attention (report to your care team if they continue or are bothersome): Acne Change in sex drive or performance Pain, redness, or irritation at the application site Unexpected breast tissue growth This list may not describe all possible side effects. Call your doctor for medical advice about side effects. You may report side effects to FDA at 1-800-FDA-1088. Where should I keep my medication? Keep out of the reach of children. This medication can be abused. Keep your medication in a safe place to protect it from theft. Do not share this medication with anyone. Selling or giving away this medication is dangerous and against the law. Store at room temperature between 20 and 25 degrees C (68 and 77 degrees F). Do not freeze. Protect from light. Follow the directions for the product you are prescribed. Throw away any unused medication after the expiration date. NOTE: This sheet is a summary. It may not cover all possible information. If you have questions about this medicine, talk to your doctor, pharmacist, or health care provider.  2022 Elsevier/Gold Standard (2020-07-01 12:47:57)  

## 2021-05-10 ENCOUNTER — Emergency Department (HOSPITAL_COMMUNITY): Payer: Worker's Compensation

## 2021-05-10 ENCOUNTER — Other Ambulatory Visit: Payer: Self-pay

## 2021-05-10 ENCOUNTER — Encounter (HOSPITAL_COMMUNITY): Payer: Self-pay | Admitting: *Deleted

## 2021-05-10 ENCOUNTER — Emergency Department (HOSPITAL_COMMUNITY)
Admission: EM | Admit: 2021-05-10 | Discharge: 2021-05-10 | Disposition: A | Discharge status: Home / Self Care | Payer: Worker's Compensation | Attending: Emergency Medicine | Admitting: Emergency Medicine

## 2021-05-10 DIAGNOSIS — S43102A Unspecified dislocation of left acromioclavicular joint, initial encounter: Secondary | ICD-10-CM | POA: Diagnosis not present

## 2021-05-10 DIAGNOSIS — Z87891 Personal history of nicotine dependence: Secondary | ICD-10-CM | POA: Insufficient documentation

## 2021-05-10 DIAGNOSIS — W010XXA Fall on same level from slipping, tripping and stumbling without subsequent striking against object, initial encounter: Secondary | ICD-10-CM | POA: Diagnosis not present

## 2021-05-10 DIAGNOSIS — I10 Essential (primary) hypertension: Secondary | ICD-10-CM | POA: Diagnosis not present

## 2021-05-10 DIAGNOSIS — S4992XA Unspecified injury of left shoulder and upper arm, initial encounter: Secondary | ICD-10-CM | POA: Diagnosis present

## 2021-05-10 DIAGNOSIS — Z79899 Other long term (current) drug therapy: Secondary | ICD-10-CM | POA: Insufficient documentation

## 2021-05-10 DIAGNOSIS — W19XXXA Unspecified fall, initial encounter: Secondary | ICD-10-CM

## 2021-05-10 MED ORDER — KETOROLAC TROMETHAMINE 30 MG/ML IJ SOLN
30.0000 mg | Freq: Once | INTRAMUSCULAR | Status: AC
  Administered 2021-05-10: 30 mg via INTRAMUSCULAR
  Filled 2021-05-10: qty 1

## 2021-05-10 MED ORDER — NAPROXEN 500 MG PO TABS: 500.0000 mg | ORAL_TABLET | Freq: Two times a day (BID) | ORAL | 0 refills | Status: AC

## 2021-05-10 NOTE — Discharge Instructions (Signed)
You may alternate taking Tylenol and Naproxen as needed for pain control. You may take Naproxen twice daily as directed on your discharge paperwork and you may take  [phone] mg of Tylenol every 6 hours. Do not exceed 4000 mg of Tylenol daily as this can lead to liver damage. Also, make sure to take Naproxen with meals as it can cause an upset stomach. Do not take other NSAIDs while taking Naproxen such as (Aleve, Ibuprofen, Ketorolac, Aspirin, Celebrex, etc) and do not take more than the prescribed dose as this can lead to ulcers and bleeding in your GI tract. You may use warm and cold compresses to help with your symptoms.   Please follow up with Dr. Dallas Schimke office within the next 5-10 days for re-evaluation and further treatment of your symptoms.   Please return to the ER sooner if you have any new or worsening symptoms.

## 2021-05-10 NOTE — ED Triage Notes (Signed)
Pt fell and tried to catch his fall landing on his left shoulder.  Pt with pain to left shoulder. Numbness to left thumb, index, middle and ring fingers.

## 2021-05-10 NOTE — ED Provider Notes (Signed)
[facility] EMERGENCY DEPARTMENT Provider Note   CSN: [id number] Arrival date & time: 05/10/21  1043     History Chief Complaint  Patient presents with   Jamie Richmond is a 45 y.o. male.  HPI  45 year old male with a history of essential hypertension, GERD, low back pain, nephrolithiasis, OSA, who presents to the emergency department today for evaluation of a fall.  Patient states that he was walking prior to arrival when he tripped and fell onto his left shoulder.  He is complaining of pain to the anterior and posterior left shoulder.  Pain radiates down the left upper extremity and he has some paresthesias and decreased sensation to the thumb and second through fourth fingers.  He denies any head trauma, LOC or other injuries.  Past Medical History:  Diagnosis Date   Essential hypertension    GERD (gastroesophageal reflux disease)    Low back pain    Nephrolithiasis    OSA (obstructive sleep apnea)    Not on CPAP    Patient Active Problem List   Diagnosis Date Noted   Male hypogonadism 01/29/2021   Erectile dysfunction due to arterial insufficiency 01/29/2021    Past Surgical History:  Procedure Laterality Date   LITHOTRIPSY     VASECTOMY  2001   WRIST SURGERY Right    age 14       Family History  Problem Relation Age of Onset   Heart attack Mother    Hypertension Mother    Hypertension Father    Heart attack Father    Bipolar disorder Sister    Hypertension Sister    Crohn's disease Sister    Bipolar disorder Sister    Hypertension Sister     Social History   Tobacco Use   Smoking status: Former    Packs/day: 0.50    Types: Cigarettes    Start date: 10/30/2012   Smokeless tobacco: Never  Substance Use Topics   Alcohol use: Yes    Comment: Occasional   Drug use: Never    Home Medications Prior to Admission medications   Medication Sig Start Date End Date Taking? Authorizing Provider  naproxen (NAPROSYN) 500 MG tablet Take 1  tablet (500 mg total) by mouth 2 (two) times daily for 7 days. 05/10/21 05/17/21 Yes Kenderick Kobler S, PA-C  gabapentin (NEURONTIN) 100 MG capsule Take 100 mg by mouth at bedtime.    [provider]  gabapentin (NEURONTIN) 300 MG capsule Take 300 mg by mouth 3 (three) times daily. 04/01/21   [provider]  losartan (COZAAR) 25 MG tablet Take 25 mg by mouth daily.    [provider]  losartan (COZAAR) 50 MG tablet Take 50 mg by mouth daily. 12/24/20   [provider]  meloxicam (MOBIC) 15 MG tablet Take 15 mg by mouth daily.    [provider]  pantoprazole (PROTONIX) 40 MG tablet Take 40 mg by mouth 2 (two) times daily.    [provider]  rosuvastatin (CRESTOR) 10 MG tablet Take 10 mg by mouth at bedtime. 12/24/20   [provider]  sildenafil (REVATIO) 20 MG tablet Take 1 tablet (20 mg total) by mouth as needed. 01/29/21   McKenzie, Mardene Celeste, MD  tadalafil (CIALIS) 20 MG tablet Take 1 tablet (20 mg total) by mouth daily as needed. 05/07/21   McKenzie, Mardene Celeste, MD  testosterone cypionate (DEPOTESTOSTERONE CYPIONATE) 200 MG/ML injection Inject 0.5 mLs (100 mg total) into the muscle  every 7 (seven) days. 05/07/21   McKenzie, Mardene Celeste, MD  tiZANidine (ZANAFLEX) 4 MG tablet Take 4 mg by mouth every 8 (eight) hours as needed for muscle spasms.    [provider]  zolpidem (AMBIEN) 10 MG tablet Take 10 mg by mouth at bedtime as needed for sleep.    [provider]    Allergies    Patient has no known allergies.  Review of Systems   Review of Systems  Musculoskeletal:        Left shoulder pain  Skin:  Negative for wound.  Neurological:  Positive for weakness and numbness.       No head trauma or loc   Physical Exam Updated Vital Signs BP (!) 154/98 (BP Location: Right Arm)   Pulse 87   Temp 98.5 F (36.9 C) (Oral)   Resp 17   Ht 5\' 10"  (1.778 m)   Wt 97.5 kg   BMI 30.85 kg/m   Physical  Exam Constitutional:      General: He is not in acute distress.    Appearance: He is well-developed.  Eyes:     Conjunctiva/sclera: Conjunctivae normal.  Cardiovascular:     Rate and Rhythm: Normal rate.  Pulmonary:     Effort: Pulmonary effort is normal.  Musculoskeletal:     Comments: TTP to the anterior left shoulder over the biceps tendon. TTP noted to the posterior shoulder as well. No TTP over the clavicle. Radial pulses 2+ bilat. Decreased sensation to the left thumb, index finger, long finger and ring finger. Brisk cap refill to all fingers.   Skin:    General: Skin is warm and dry.  Neurological:     Mental Status: He is alert and oriented to person, place, and time.    ED Results / Procedures / Treatments   Labs (all labs ordered are listed, but only abnormal results are displayed) Labs Reviewed - No data to display  EKG None  Radiology DG Shoulder Left  Result Date: 05/10/2021 CLINICAL DATA:  Provided history: Fall. Additional history provided: Fall landing on left shoulder, patient reports left shoulder pain, numbness radiating into left arm and left hand. EXAM: LEFT SHOULDER - 2+ VIEW COMPARISON:  None FINDINGS: There is mild elevation of the left clavicle relative to the acromion, and findings are compatible with acromioclavicular joint injury. No acute fracture is identified. IMPRESSION: There is mild elevation of the left clavicle relative to the acromion, and findings are compatible with acromioclavicular joint injury. No evidence of acute fracture. Electronically Signed   By: 08-13-1969 D.O.   On: 05/10/2021 12:31    Procedures Procedures   Medications Ordered in ED Medications  ketorolac (TORADOL) 30 MG/ML injection 30 mg (30 mg Intramuscular Given 05/10/21 1307)    ED Course  I have reviewed the triage vital signs and the nursing notes.  Pertinent labs & imaging results that were available during my care of the patient were reviewed by me and  considered in my medical decision making (see chart for details).    MDM Rules/Calculators/A&P                          Pt here for eval after mechanical fall. He is c/o left shoulder pain.   Xray left shoulder - There is mild elevation of the left clavicle relative to the acromion, and findings are compatible with acromioclavicular joint injury. No evidence of acute fracture  1:11 PM CONSULT  with Dr. Dallas Schimke with orthopedics. States that neurovascular injury would be very unlikely given there was no dislocation of the shoulder joint. Suspects that sensory changes with be transient and recommends f/u in the office later this week.  Reassessed pt and discussed findings and plan. Advised on f/u and return precautions. He voices understanding and is in agreement. All questions answered, pt stable for discharge.   Final Clinical Impression(s) / ED Diagnoses Final diagnoses:  Fall, initial encounter  Separation of left acromioclavicular joint, initial encounter    Rx / DC Orders ED Discharge Orders          Ordered    naproxen (NAPROSYN) 500 MG tablet  2 times daily        05/10/21 [address], Dilan Fullenwider S, PA-C 05/10/21 1322    Pricilla Loveless, MD 05/11/21 0740

## 2021-05-17 ENCOUNTER — Telehealth: Payer: Self-pay

## 2021-05-17 NOTE — Telephone Encounter (Signed)
Appeal letter and notes sent via fax [phone]

## 2021-07-28 ENCOUNTER — Telehealth: Payer: Self-pay

## 2021-07-28 NOTE — Telephone Encounter (Signed)
Patient called office to state [facility] never received last rx for testosterone.   Per patient [facility] only has 0.27ml dosage.  Reviewed last OV with patient and per Dr. Ronne Binning patient to continue 100mg  IM Testosterone every 7 days- inject 0.44ml.  Patient states at last OV MD would increase to 20ml.  Message sent to provider.

## 2021-08-06 NOTE — Telephone Encounter (Signed)
Patient called and notified. Voiced understanding.,

## 2021-10-29 ENCOUNTER — Other Ambulatory Visit: Payer: Self-pay

## 2021-11-05 ENCOUNTER — Ambulatory Visit: Payer: Self-pay | Admitting: Urology

## 2021-11-05 ENCOUNTER — Other Ambulatory Visit: Payer: Self-pay

## 2021-11-05 DIAGNOSIS — E291 Testicular hypofunction: Secondary | ICD-10-CM

## 2021-11-07 LAB — COMPREHENSIVE METABOLIC PANEL
ALT: 35 IU/L (ref 0–44)
AST: 17 IU/L (ref 0–40)
Albumin/Globulin Ratio: 1.9 (ref 1.2–2.2)
Albumin: 4.4 g/dL (ref 4.0–5.0)
Alkaline Phosphatase: 89 IU/L (ref 44–121)
BUN/Creatinine Ratio: 12 (ref 9–20)
BUN: 15 mg/dL (ref 6–24)
Bilirubin Total: 0.9 mg/dL (ref 0.0–1.2)
CO2: 24 mmol/L (ref 20–29)
Calcium: 9.9 mg/dL (ref 8.7–10.2)
Chloride: 102 mmol/L (ref 96–106)
Creatinine, Ser: 1.23 mg/dL (ref 0.76–1.27)
Globulin, Total: 2.3 g/dL (ref 1.5–4.5)
Glucose: 79 mg/dL (ref 70–99)
Potassium: 4.5 mmol/L (ref 3.5–5.2)
Sodium: 140 mmol/L (ref 134–144)
Total Protein: 6.7 g/dL (ref 6.0–8.5)
eGFR: 74 mL/min/{1.73_m2} (ref >59)

## 2021-11-07 LAB — CBC WITH DIFFERENTIAL
Basophils Absolute: 0.1 10*3/uL (ref 0.0–0.2)
Basos: 1 %
EOS (ABSOLUTE): 0.1 10*3/uL (ref 0.0–0.4)
Eos: 1 %
Hematocrit: 49.4 % (ref 37.5–51.0)
Hemoglobin: 17 g/dL (ref 13.0–17.7)
Immature Grans (Abs): 0 10*3/uL (ref 0.0–0.1)
Immature Granulocytes: 0 %
Lymphocytes Absolute: 2.2 10*3/uL (ref 0.7–3.1)
Lymphs: 29 %
MCH: 32.1 pg (ref 26.6–33.0)
MCHC: 34.4 g/dL (ref 31.5–35.7)
MCV: 93 fL (ref 79–97)
Monocytes Absolute: 0.7 10*3/uL (ref 0.1–0.9)
Monocytes: 9 %
Neutrophils Absolute: 4.4 10*3/uL (ref 1.4–7.0)
Neutrophils: 60 %
RBC: 5.3 x10E6/uL (ref 4.14–5.80)
RDW: 13.3 % (ref 11.6–15.4)
WBC: 7.4 10*3/uL (ref 3.4–10.8)

## 2021-11-07 LAB — TESTOSTERONE,FREE AND TOTAL
Testosterone, Free: 7.3 pg/mL (ref 6.8–21.5)
Testosterone: 258 ng/dL — ABNORMAL LOW (ref 264–916)

## 2021-11-07 LAB — ESTRADIOL: Estradiol: 19.4 pg/mL (ref 7.6–42.6)

## 2021-11-12 ENCOUNTER — Ambulatory Visit: Payer: Self-pay | Admitting: Urology

## 2022-06-07 IMAGING — DX DG SHOULDER 2+V*L*
3 series · 3 of 3 positions shown · non-contrast
Comparison: None

CLINICAL DATA: Provided history: Fall. Additional history provided:
Fall landing on left shoulder, patient reports left shoulder pain,
numbness radiating into left arm and left hand.

EXAM:
LEFT SHOULDER - 2+ VIEW

[shoulder grashey]
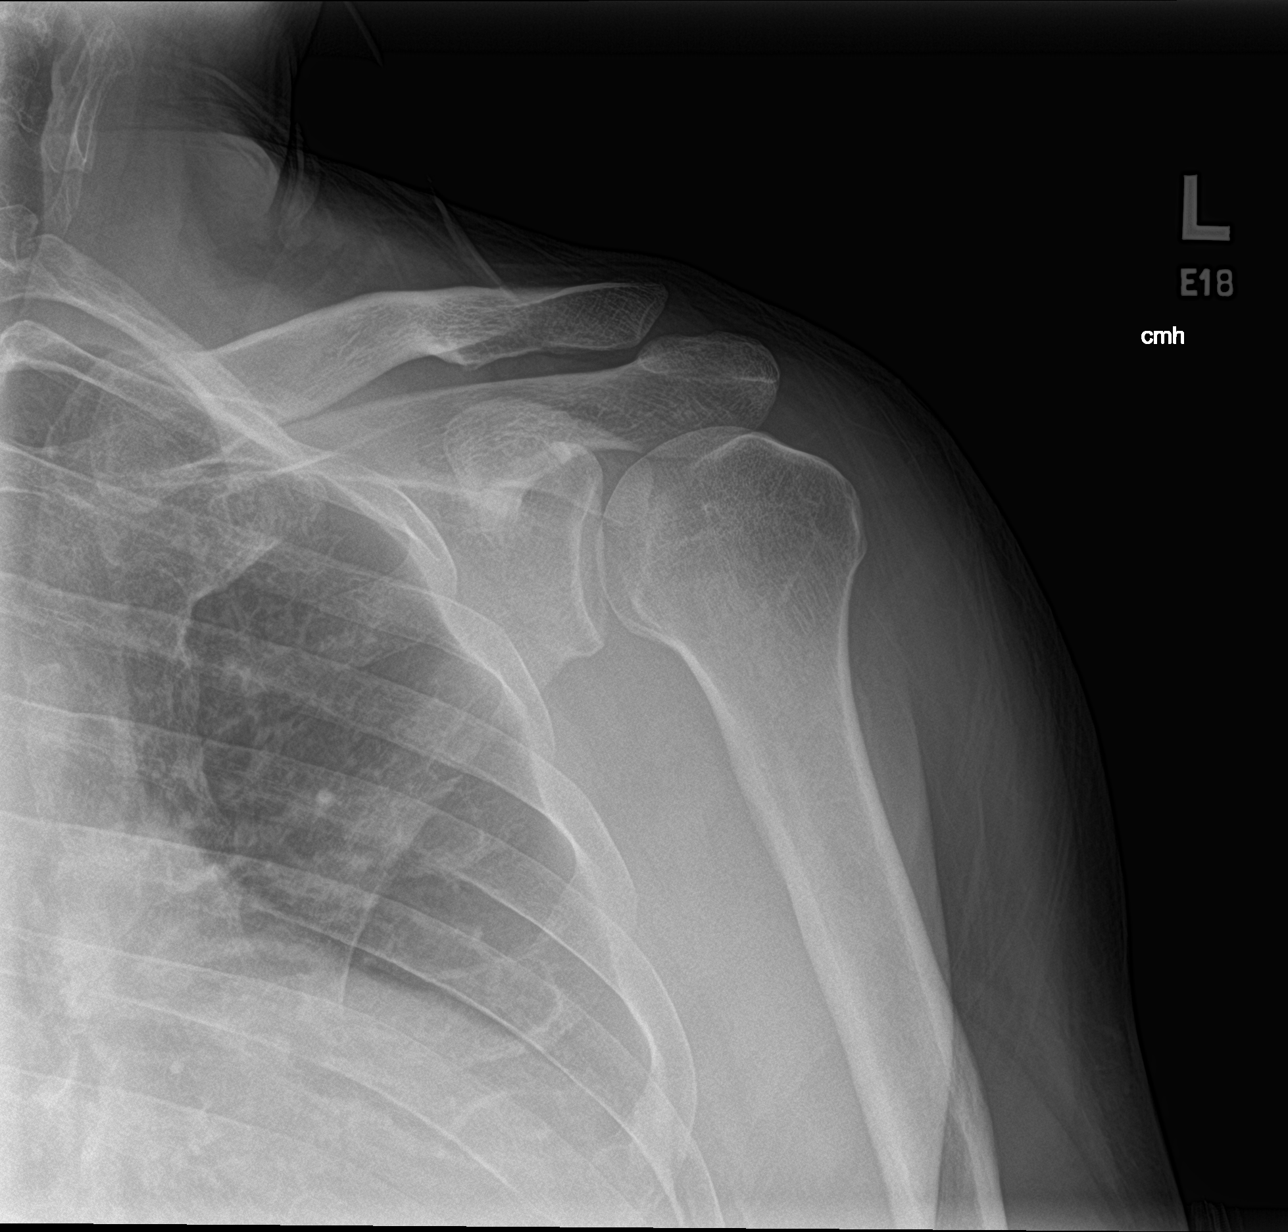

[shoulder y view]
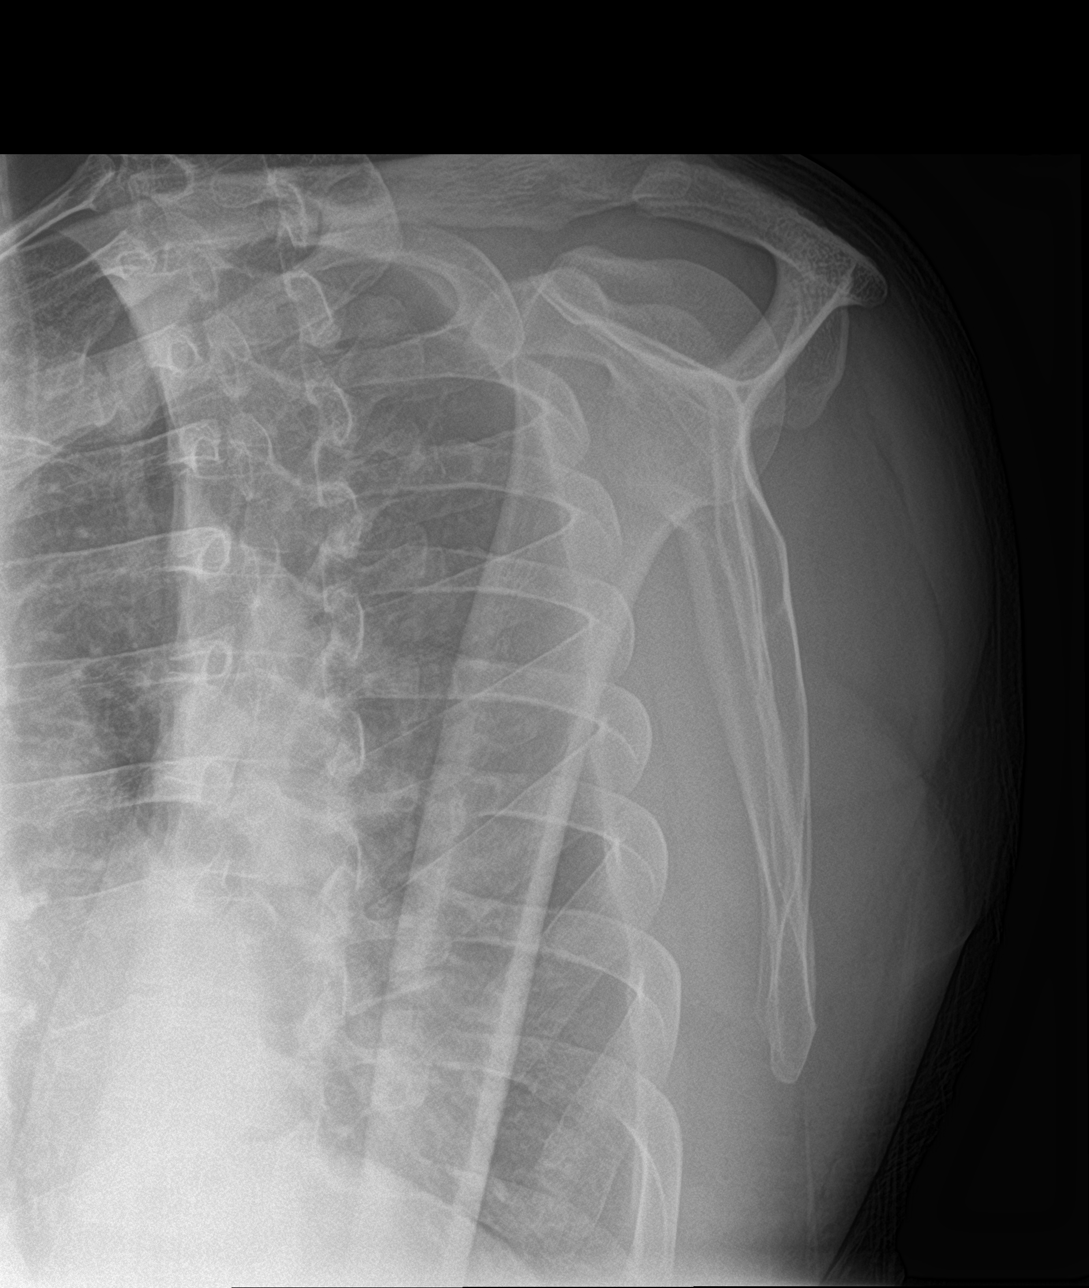

[shoulder axillary]
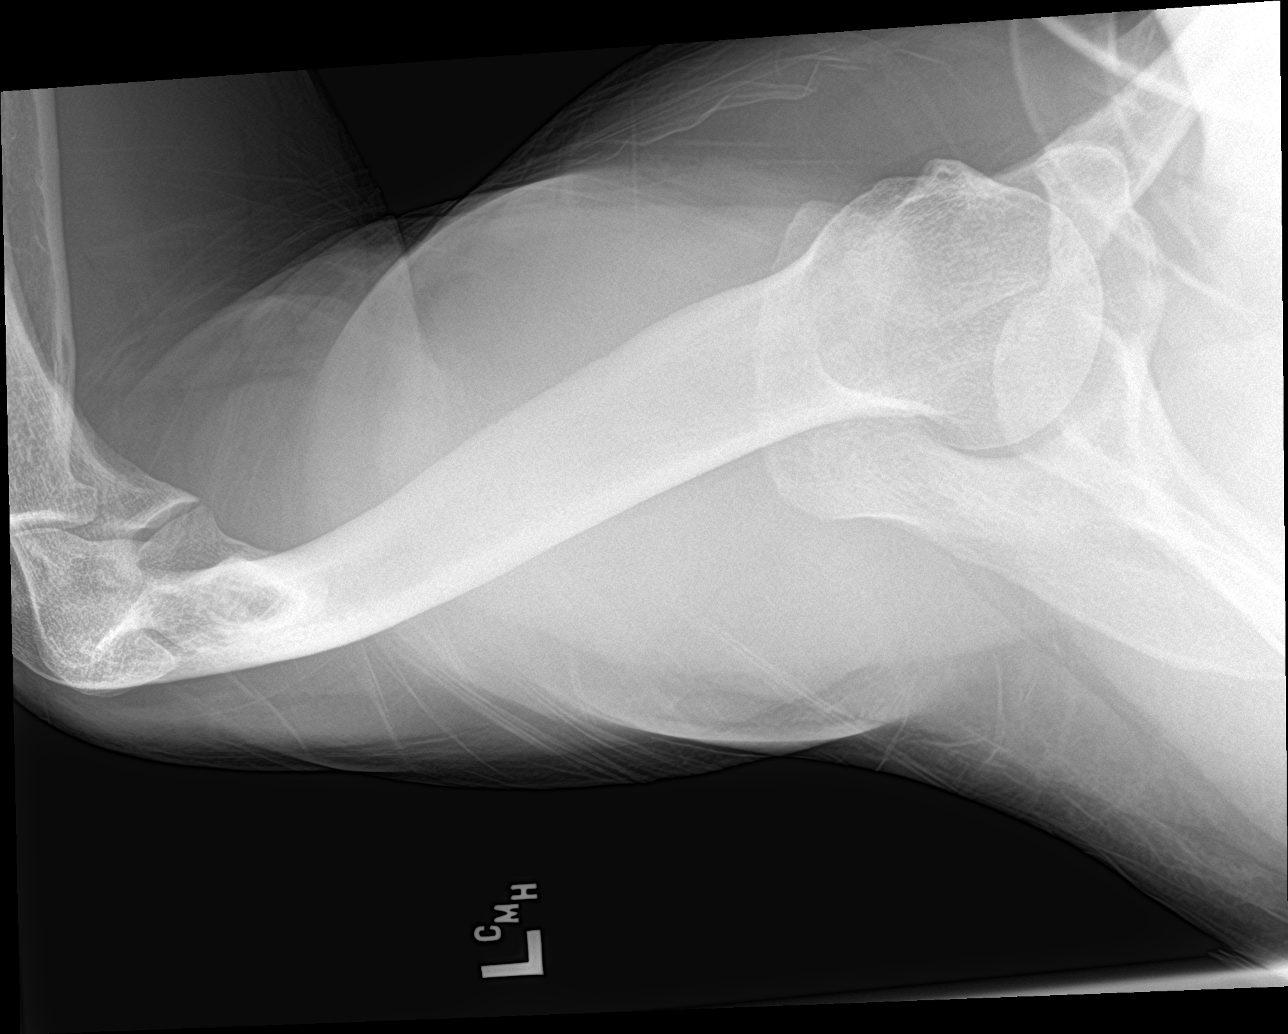

[3 of 3 positions shown; findings below may reference images not displayed]

FINDINGS: There is mild elevation of the left clavicle relative to the
acromion, and findings are compatible with acromioclavicular joint
injury. No acute fracture is identified.
IMPRESSION: There is mild elevation of the left clavicle relative to the
acromion, and findings are compatible with acromioclavicular joint
injury.

No evidence of acute fracture.

## 2022-07-26 ENCOUNTER — Other Ambulatory Visit: Payer: Self-pay | Admitting: Family Medicine

## 2022-07-26 DIAGNOSIS — R300 Dysuria: Secondary | ICD-10-CM

## 2022-08-12 ENCOUNTER — Ambulatory Visit
Admission: RE | Admit: 2022-08-12 | Discharge: 2022-08-12 | Disposition: A | Discharge status: Home / Self Care | Payer: Self-pay | Source: Ambulatory Visit | Attending: Family Medicine | Admitting: Family Medicine

## 2022-08-12 DIAGNOSIS — R300 Dysuria: Secondary | ICD-10-CM

## 2022-08-12 MED ORDER — GADOPICLENOL 0.5 MMOL/ML IV SOLN
10.0000 mL | Freq: Once | INTRAVENOUS | Status: AC | PRN
  Administered 2022-08-12: 10 mL via INTRAVENOUS

## 2022-09-27 ENCOUNTER — Other Ambulatory Visit: Payer: Self-pay | Admitting: Urology

## 2024-02-20 DIAGNOSIS — M51379 Other intervertebral disc degeneration, lumbosacral region without mention of lumbar back pain or lower extremity pain: Secondary | ICD-10-CM | POA: Insufficient documentation

## 2024-07-09 ENCOUNTER — Emergency Department (HOSPITAL_COMMUNITY)
Admission: EM | Admit: 2024-07-09 | Discharge: 2024-07-10 | Disposition: A | Discharge status: Home / Self Care | Attending: Emergency Medicine | Admitting: Emergency Medicine

## 2024-07-09 ENCOUNTER — Other Ambulatory Visit: Payer: Self-pay

## 2024-07-09 ENCOUNTER — Encounter (HOSPITAL_COMMUNITY): Payer: Self-pay

## 2024-07-09 DIAGNOSIS — I4891 Unspecified atrial fibrillation: Secondary | ICD-10-CM | POA: Diagnosis not present

## 2024-07-09 DIAGNOSIS — Z7901 Long term (current) use of anticoagulants: Secondary | ICD-10-CM | POA: Diagnosis not present

## 2024-07-09 DIAGNOSIS — R002 Palpitations: Secondary | ICD-10-CM | POA: Diagnosis present

## 2024-07-09 DIAGNOSIS — M7552 Bursitis of left shoulder: Secondary | ICD-10-CM | POA: Insufficient documentation

## 2024-07-09 DIAGNOSIS — G8929 Other chronic pain: Secondary | ICD-10-CM | POA: Insufficient documentation

## 2024-07-09 DIAGNOSIS — G894 Chronic pain syndrome: Secondary | ICD-10-CM | POA: Insufficient documentation

## 2024-07-09 DIAGNOSIS — G5603 Carpal tunnel syndrome, bilateral upper limbs: Secondary | ICD-10-CM | POA: Insufficient documentation

## 2024-07-09 DIAGNOSIS — M5412 Radiculopathy, cervical region: Secondary | ICD-10-CM | POA: Insufficient documentation

## 2024-07-09 DIAGNOSIS — M47817 Spondylosis without myelopathy or radiculopathy, lumbosacral region: Secondary | ICD-10-CM | POA: Insufficient documentation

## 2024-07-09 DIAGNOSIS — F418 Other specified anxiety disorders: Secondary | ICD-10-CM | POA: Insufficient documentation

## 2024-07-09 NOTE — ED Triage Notes (Signed)
 Pt states he does not feel right and cannot describe the feeling.

## 2024-07-10 LAB — BASIC METABOLIC PANEL WITH GFR
Anion gap: 10 (ref 5–15)
BUN: 15 mg/dL (ref 6–20)
CO2: 27 mmol/L (ref 22–32)
Calcium: 9.2 mg/dL (ref 8.9–10.3)
Chloride: 102 mmol/L (ref 98–111)
Creatinine, Ser: 1.31 mg/dL — ABNORMAL HIGH (ref 0.61–1.24)
GFR, Estimated: >60 mL/min
Glucose, Bld: 100 mg/dL — ABNORMAL HIGH (ref 70–99)
Potassium: 3.8 mmol/L (ref 3.5–5.1)
Sodium: 139 mmol/L (ref 135–145)

## 2024-07-10 LAB — CBC WITH DIFFERENTIAL/PLATELET
Basophils Absolute: 0.1 K/uL (ref 0.0–0.1)
Basophils Relative: 1 %
Eosinophils Absolute: 0.2 K/uL (ref 0.0–0.5)
Eosinophils Relative: 2 %
HCT: 45.7 % (ref 39.0–52.0)
Hemoglobin: 16.8 g/dL (ref 13.0–17.0)
Lymphocytes Relative: 43 %
Lymphs Abs: 3.7 K/uL (ref 0.7–4.0)
MCH: 31.9 pg (ref 26.0–34.0)
MCHC: 36.8 g/dL — ABNORMAL HIGH (ref 30.0–36.0)
MCV: 86.9 fL (ref 80.0–100.0)
Monocytes Absolute: 0.9 K/uL (ref 0.1–1.0)
Monocytes Relative: 11 %
Neutro Abs: 3.7 K/uL (ref 1.7–7.7)
Neutrophils Relative %: 43 %
Platelets: 131 K/uL — ABNORMAL LOW (ref 150–400)
RBC: 5.26 MIL/uL (ref 4.22–5.81)
RDW: 12.4 % (ref 11.5–15.5)
WBC: 8.5 K/uL (ref 4.0–10.5)
nRBC: 0.3 % — ABNORMAL HIGH (ref 0.0–0.2)

## 2024-07-10 LAB — MAGNESIUM: Magnesium: 2.1 mg/dL (ref 1.7–2.4)

## 2024-07-10 LAB — TSH: TSH: 2.95 u[IU]/mL (ref 0.350–4.500)

## 2024-07-10 MED ORDER — METOPROLOL TARTRATE 25 MG PO TABS
25.0000 mg | ORAL_TABLET | Freq: Once | ORAL | Status: AC
  Administered 2024-07-10: 25 mg via ORAL
  Filled 2024-07-10: qty 1

## 2024-07-10 MED ORDER — APIXABAN 5 MG PO TABS: 5.0000 mg | ORAL_TABLET | Freq: Two times a day (BID) | ORAL | 0 refills | Status: AC

## 2024-07-10 MED ORDER — METOPROLOL TARTRATE 25 MG PO TABS: 25.0000 mg | ORAL_TABLET | ORAL | 0 refills | Status: AC | PRN

## 2024-07-10 MED ORDER — METOPROLOL SUCCINATE ER 25 MG PO TB24: 25.0000 mg | ORAL_TABLET | Freq: Every day | ORAL | 1 refills | Status: AC

## 2024-07-10 NOTE — Discharge Instructions (Addendum)
 Prescriptions were sent to your pharmacy: -Metoprolol  is a blood pressure medication that can also help minimize atrial fibrillation. Take metoprolol  succinate (long-acting) daily.  If you have any further episodes, you can take the metoprolol  tartrate (short acting) as needed.  If symptoms persist despite extra dose, return to the emergency department. -Eliquis  is a blood thinning medication.  This is to minimize stroke risk associated with atrial fibrillation.  If you do decide to start it, take twice daily.  Discuss this further with cardiology as well.  A referral for cardiology follow-up was ordered.  If you do not hear from them by early next week, call the telephone number below.

## 2024-07-10 NOTE — ED Provider Notes (Signed)
 " [facility] AT [facility] Provider Note   CSN: [id number] Arrival date & time: 07/09/24  2333     Patient presents with: Palpitations   Jamie Richmond is a 48 y.o. male.    Palpitations Associated symptoms: shortness of breath   Patient presents for shortness of breath and fatigue.  Medical history includes chronic pain, OSA.  He was last seen by cardiology 4 years ago.  This was reportedly for a workup for his job.  Over the past month, patient has had intermittent episodes of shortness of breath and fatigue.  He denies sensation of heart racing.  He does have a difficult time describing the symptoms.  He feels that symptoms typically occur in the evening, after he gets home after a busy day.  He had 1 such episode today which prompted him to come to the ED.     Prior to Admission medications  Medication Sig Start Date End Date Taking? Authorizing Provider  apixaban  (ELIQUIS ) 5 MG TABS tablet Take 1 tablet (5 mg total) by mouth 2 (two) times daily. 07/10/24 08/09/24 Yes Melvenia Motto, MD  metoprolol  succinate (TOPROL -XL) 25 MG 24 hr tablet Take 1 tablet (25 mg total) by mouth daily. 07/10/24 09/08/24 Yes Melvenia Motto, MD  metoprolol  tartrate (LOPRESSOR ) 25 MG tablet Take 1 tablet (25 mg total) by mouth as needed (Rapid heart rate). 07/10/24  Yes Melvenia Motto, MD  gabapentin (NEURONTIN) 100 MG capsule Take 100 mg by mouth at bedtime.    [provider]  gabapentin (NEURONTIN) 300 MG capsule Take 300 mg by mouth 3 (three) times daily. 04/01/21   [provider]  losartan (COZAAR) 25 MG tablet Take 25 mg by mouth daily.    [provider]  losartan (COZAAR) 50 MG tablet Take 50 mg by mouth daily. 12/24/20   [provider]  meloxicam (MOBIC) 15 MG tablet Take 15 mg by mouth daily.    [provider]  pantoprazole (PROTONIX) 40 MG tablet Take 40 mg by mouth 2 (two) times daily.    [provider]  rosuvastatin  (CRESTOR) 10 MG tablet Take 10 mg by mouth at bedtime. 12/24/20   [provider]  sildenafil  (REVATIO ) 20 MG tablet Take 1 tablet (20 mg total) by mouth as needed. 01/29/21   McKenzie, Belvie CROME, MD  tadalafil  (CIALIS ) 20 MG tablet TAKE 1 TABLET BY MOUTH ONCE DAILY AS NEEDED 09/28/22   McKenzie, Belvie CROME, MD  testosterone  cypionate (DEPOTESTOSTERONE CYPIONATE) 200 MG/ML injection Inject 0.5 mLs (100 mg total) into the muscle every 7 (seven) days. 05/07/21   McKenzie, Belvie CROME, MD  tiZANidine (ZANAFLEX) 4 MG tablet Take 4 mg by mouth every 8 (eight) hours as needed for muscle spasms.    [provider]  zolpidem (AMBIEN) 10 MG tablet Take 10 mg by mouth at bedtime as needed for sleep.    [provider]    Allergies: Patient has no known allergies.    Review of Systems  Constitutional:  Positive for fatigue.  Respiratory:  Positive for shortness of breath.   All other systems reviewed and are negative.   Updated Vital Signs BP 118/87   Pulse 66   Temp 98 F (36.7 C)   Resp 19   Ht 5' 10 (1.778 m)   Wt 88.5 kg   SpO2 94%   BMI 27.98 kg/m   Physical Exam Vitals and nursing note reviewed.  Constitutional:  General: He is not in acute distress.    Appearance: Normal appearance. He is well-developed. He is not ill-appearing, toxic-appearing or diaphoretic.  HENT:     Head: Normocephalic and atraumatic.     Right Ear: External ear normal.     Left Ear: External ear normal.     Nose: Nose normal.     Mouth/Throat:     Mouth: Mucous membranes are moist.  Eyes:     Extraocular Movements: Extraocular movements intact.     Conjunctiva/sclera: Conjunctivae normal.  Cardiovascular:     Rate and Rhythm: Normal rate and regular rhythm.  Pulmonary:     Effort: Pulmonary effort is normal. No respiratory distress.  Abdominal:     General: There is no distension.     Palpations: Abdomen is soft.  Musculoskeletal:        General: No swelling. Normal range  of motion.     Cervical back: Normal range of motion and neck supple.  Skin:    General: Skin is warm and dry.     Coloration: Skin is not jaundiced or pale.  Neurological:     General: No focal deficit present.     Mental Status: He is alert and oriented to person, place, and time.  Psychiatric:        Mood and Affect: Mood normal.        Behavior: Behavior normal.     (all labs ordered are listed, but only abnormal results are displayed) Labs Reviewed  CBC WITH DIFFERENTIAL/PLATELET - Abnormal; Notable for the following components:      Result Value   MCHC 36.8 (*)    Platelets 131 (*)    nRBC 0.3 (*)    All other components within normal limits  BASIC METABOLIC PANEL WITH GFR - Abnormal; Notable for the following components:   Glucose, Bld 100 (*)    Creatinine, Ser 1.31 (*)    All other components within normal limits  MAGNESIUM  TSH    EKG: EKG Interpretation Date/Time:  Wednesday July 10 2024 00:11:06 EST Ventricular Rate:  97 PR Interval:  150 QRS Duration:  104 QT Interval:  347 QTC Calculation: 441 R Axis:   65  Text Interpretation: Sinus rhythm Consider left atrial enlargement Abnormal R-wave progression, early transition Confirmed by Melvenia Motto ([id number]) on 07/10/2024 2:04:02 AM  Radiology: No results found.   Procedures   Medications Ordered in the ED  metoprolol  tartrate (LOPRESSOR ) tablet 25 mg (25 mg Oral Given 07/10/24 0058)                                    Medical Decision Making Amount and/or Complexity of Data Reviewed Labs: ordered.  Risk Prescription drug management.   This patient presents to the ED for concern of fatigue, this involves an extensive number of treatment options, and is a complaint that carries with it a high risk of complications and morbidity.  The differential diagnosis includes arrhythmia, anemia, metabolic derangements, anxiety   Co morbidities / Chronic conditions that complicate the patient  evaluation  chronic pain, OSA.   Additional history obtained:  Additional history obtained from EMR External records from outside source obtained and reviewed including patient's wife   Lab Tests:  I Ordered, and personally interpreted labs.  The pertinent results include: Normal hemoglobin, no leukocytosis, normal kidney function, normal electrolytes  Cardiac Monitoring: / EKG:  The patient was maintained on  a cardiac monitor.  I personally viewed and interpreted the cardiac monitored which showed an underlying rhythm of: Initially atrial fibrillation with RVR, spontaneously converted to normal sinus rhythm   Problem List / ED Course / Critical interventions / Medication management  Patient presenting for intermittent episodes of fatigue and shortness of breath.  He had a similar episode tonight which prompted him to come to the ED.  On arrival in the ED, EKG shows atrial fibrillation with RVR.  Patient has no known history of the same.  Patient was bedded and placed on cardiac monitor.  Shortly after his arrival, patient spontaneously converted to normal sinus rhythm.  On exam, he is well-appearing.  He has a difficult time explaining his recent symptoms.  He first noticed them about a month ago.  They do not occur every day.  Workup was initiated.  Lab work shows normal left electrolytes and normal TSH.  He remained in normal sinus rhythm for remainder of his ED observation time.  Patient was prescribed metoprolol .  I spoke to him about stroke risk associated with atrial fibrillation and anticoagulation.  Patient is hesitant to initiate anticoagulation.  He states that he works with his hands a lot and often gets cuts.  Patient was prescribed a 30-day course of Eliquis .  He is not sure if he will take it.  Patient to discuss further with cardiology.  Cardiology referral was ordered.  Patient was discharged in stable condition. I ordered medication including metoprolol  for atrial  fibrillation Reevaluation of the patient after these medicines showed that the patient improved I have reviewed the patients home medicines and have made adjustments as needed  Social Determinants of Health:  Lives independently     Final diagnoses:  Atrial fibrillation with RVR [facility])    ED Discharge Orders          Ordered    Ambulatory referral to Cardiology  Status:  Canceled       Comments: If you have not heard from the Cardiology office within the next 72 hours please call [phone].   07/10/24 0204    apixaban  (ELIQUIS ) 5 MG TABS tablet  2 times daily        07/10/24 0242    metoprolol  tartrate (LOPRESSOR ) 25 MG tablet  As needed        07/10/24 0242    metoprolol  succinate (TOPROL -XL) 25 MG 24 hr tablet  Daily        07/10/24 0242    Ambulatory referral to Cardiology        07/10/24 0242               Melvenia Motto, MD 07/10/24 [phone]  "

## 2024-07-10 NOTE — ED Notes (Signed)
 Noted that pt converted from Afib to NSR- EKG captured- Dr Melvenia aware and given EKG

## 2024-07-10 NOTE — ED Notes (Signed)
 ED Provider at bedside.

## 2024-07-16 NOTE — Progress Notes (Signed)
 " Cardiology Office Note:    Date:  07/22/2024   ID:  Jamie Richmond, DOB 13-Oct-1975, MRN [id number]  PCP:  Jolee Elsie RAMAN, PA   [facility] Providers Cardiologist:  None     Referring MD: Melvenia Motto, MD   Chief Complaint  Patient presents with   Atrial Fibrillation    History of Present Illness:    Jamie Richmond is a 48 y.o. male seen at the request of Dr Melvenia for evaluation of atrial fibrillation. He has a history of OSA and HTN. He was seen in the ED on 12/23 with palpitations. Was watching a movie when suddenly didn't feel right. 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 On arrival to ED was found to be in Afib with RVR. Converted spontaneously to NSR. Was DC on Eliquis  and Toprol  XL. He had a prior normal Stress Echo in 2021.   He was not aware of any triggers. No Etoh. Does not drink a lot of caffeine. Has been off BP medication with good control. Is physically very active.   Past Medical History:  Diagnosis Date   Acute recurrent ethmoidal sinusitis 07/05/2015   Acute recurrent maxillary sinusitis 05/17/2017   Acute suppurative otitis media without spontaneous rupture of ear drum, left ear 10/01/2015   Acute tonsillitis, unspecified 12/25/2014   Acute upper respiratory infection, unspecified 09/20/2015   Anticipatory anxiety 07/09/2024   ANXIETY     Bilateral carpal tunnel syndrome 07/09/2024   BILATERAL CARPAL TUNNEL SYNDROME     Bursitis of left shoulder 07/09/2024   BURSITIS OF LEFT SHOULDER     Cervical radiculopathy 07/09/2024   RADICULOPATHY OF CERVICAL SPINE (Renamed from CERVICAL RADICULOPATHY)     Chest pain 08/15/2019   Chronic neck pain 07/09/2024   CHRONIC NECK PAIN     Chronic pain syndrome 07/09/2024   CHRONIC PAIN     Degeneration of lumbosacral intervertebral disc 02/20/2024   DDD (DEGENERATIVE DISC DISEASE), LUMBAR (Renamed from DEGENERATION OF LUMBAR INTERVERTEBRAL DISC)     Dysphonia 10/19/2016    Elevated blood-pressure reading, without diagnosis of hypertension 09/14/2016   Erectile dysfunction due to arterial insufficiency 01/29/2021   Essential hypertension    GERD (gastroesophageal reflux disease)    History of urinary stone 04/12/2017   Influenza due to unidentified influenza virus with other respiratory manifestations 09/12/2014   Low back pain    Lumbosacral spondylosis without myelopathy 07/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   Social History   Socioeconomic History   Marital status: Married    Spouse name: Not on file   Number of children: Not on file   Years of education: Not on file   Highest education level: Not on file  Occupational History   Not on file  Tobacco Use   Smoking status: Former    Current packs/day: 0.50    Average packs/day: 0.5 packs/day for 11.7  years (5.9 ttl pk-yrs)    Types: Cigarettes    Start date: 10/30/2012   Smokeless tobacco: Never  Substance and Sexual Activity   Alcohol use: Yes    Comment: Occasional   Drug use: Never   Sexual activity: Not on file  Other Topics Concern   Not on file  Social History Narrative   Not on file   Social Drivers of Health   Tobacco Use: Medium Risk (07/22/2024)   Patient History    Smoking Tobacco Use: Former    Smokeless Tobacco Use: Never    Passive Exposure: Not on Actuary Strain: Not on file  Food Insecurity: Not on file  Transportation Needs: Not on file  Physical  Activity: Not on file  Stress: Not on file  Social Connections: Not on file  Depression (EYV7-0): Not on file  Alcohol Screen: Not on file  Housing: Not on file  Utilities: Not on file  Health Literacy: Not on file     Family History: The patient's family history includes Bipolar disorder in his sister and sister; Crohn's disease in his sister; Heart attack in his father and mother; Hypertension in his father, mother, sister, and sister.  ROS:   Please see the history of present illness.     All other systems reviewed and are negative.  EKGs/Labs/Other Studies Reviewed:    The following studies were reviewed today:    Ecg dated 12/24 showed NSR with normal Ecg. I have personally reviewed and interpreted this study.       Recent Labs: 07/09/2024: BUN 15; Creatinine, Ser 1.31; Hemoglobin 16.8; Magnesium 2.1; Platelets 131; Potassium 3.8; Sodium 139; TSH 2.950  Recent Lipid Panel No results found for: CHOL, TRIG, HDL, CHOLHDL, VLDL, LDLCALC, LDLDIRECT   Risk Assessment/Calculations:    CHA2DS2-VASc Score = 1   This indicates a 0.6% annual risk of stroke. The patient's score is based upon: CHF History: 0 HTN History: 1 Diabetes History: 0 Stroke History: 0 Vascular Disease History: 0 Age Score: 0 Gender Score: 0                Physical Exam:    VS:  BP 124/80 (BP Location: Left Arm, Patient Position: Sitting, Cuff Size: Normal)   Pulse 75   Ht 5' 9 (1.753 m)   SpO2 96%   BMI 28.80 kg/m     Wt Readings from Last 3 Encounters:  07/09/24 195 lb (88.5 kg)  05/10/21 215 lb (97.5 kg)  08/28/19 224 lb (101.6 kg)     GEN:  Well nourished, well developed in no acute distress HEENT: Normal NECK: No JVD; No carotid bruits LYMPHATICS: No lymphadenopathy CARDIAC: RRR, no murmurs, rubs, gallops RESPIRATORY:  Clear to auscultation without rales, wheezing or rhonchi  ABDOMEN: Soft, non-tender, non-distended MUSCULOSKELETAL:  No edema; No deformity   SKIN: Warm and dry NEUROLOGIC:  Alert and oriented x 3 PSYCHIATRIC:  Normal affect   ASSESSMENT:    1. Paroxysmal A-fib (HCC)    PLAN:    In order of problems listed above:  Pafib. One episode that converted spontaneously. CHAD vasc score of 1 with HTN for which he is currently on no medication. Prior stress Echo normal in 2021. At this point just recommend metoprolol  PRN. I don't think he  needs anticoagulation. If he has more frequent episodes of Afib could be considered for ablation. Will follow up PRN            Medication Adjustments/Labs and Tests Ordered:  Current medicines are reviewed at length with the patient today.  Concerns regarding medicines are outlined above.  No orders of the defined types were placed in this encounter.  No orders of the defined types were placed in this encounter.   Patient Instructions  Medication Instructions:  Continue same medications   Lab Work: None ordered  Testing/Procedures: None ordered  Follow-Up: At [facility], you and your health needs are our priority.  As part of our continuing mission to provide you with exceptional heart care, our providers are all part of one team.  This team includes your primary Cardiologist (physician) and Advanced Practice Providers or APPs (Physician Assistants and Nurse Practitioners) who all work together to provide you with the care you need, when you need it.  Your next appointment:  As Needed    Provider:  Dr.Arval Brandstetter   We recommend signing up for the patient portal called MyChart.  Sign up information is provided on this After Visit Summary.  MyChart is used to connect with patients for Virtual Visits (Telemedicine).  Patients are able to view lab/test results, encounter notes, upcoming appointments, etc.  Non-urgent messages can be sent to your provider as well.   To learn more about what you can do with MyChart, go to [email or web address].      Signed, Jshawn Hurta,  MD  07/22/2024 4:24 PM    [facility]     [1]  Current Meds  Medication Sig   amphetamine-dextroamphetamine (ADDERALL) 20 MG tablet Take 20 mg by mouth daily.   ketorolac  (TORADOL ) 10 MG tablet Take 10 mg by mouth every 4 (four) hours as needed.   meloxicam (MOBIC) 15 MG tablet Take 15 mg by mouth daily. (Patient taking differently: Take 15 mg by mouth as needed.)   pantoprazole (PROTONIX) 40 MG tablet Take 40 mg by mouth 2 (two) times daily.   tiZANidine (ZANAFLEX) 4 MG tablet Take 4 mg by mouth every 8 (eight) hours as needed for muscle spasms. (Patient taking differently: Take 4 mg by mouth daily as needed for muscle spasms.)   zolpidem (AMBIEN) 10 MG tablet Take 10 mg by mouth at bedtime as needed for sleep.   "

## 2024-07-22 ENCOUNTER — Ambulatory Visit: Attending: Cardiology | Admitting: Cardiology

## 2024-07-22 ENCOUNTER — Encounter: Payer: Self-pay | Admitting: Cardiology

## 2024-07-22 VITALS — BP 124/80 | HR 75 | Ht 69.0 in

## 2024-07-22 DIAGNOSIS — I480 Paroxysmal atrial fibrillation: Secondary | ICD-10-CM

## 2024-07-22 NOTE — Patient Instructions (Addendum)

## 2024-08-15 ENCOUNTER — Ambulatory Visit: Admitting: Internal Medicine
